# Patient Record
Sex: Male | Born: 1980 | Race: Black or African American | Hispanic: No | Marital: Single | Smoking: Current some day smoker
Health system: Southern US, Community
[De-identification: ages and names within clinical notes are randomized; demographics above are authoritative.]

## PROBLEM LIST (undated history)

## (undated) DIAGNOSIS — N200 Calculus of kidney: Secondary | ICD-10-CM

## (undated) DIAGNOSIS — W3400XA Accidental discharge from unspecified firearms or gun, initial encounter: Secondary | ICD-10-CM

## (undated) HISTORY — PX: COLON SURGERY: SHX602

---

## 2012-12-10 ENCOUNTER — Emergency Department (HOSPITAL_COMMUNITY): Payer: Self-pay

## 2012-12-10 ENCOUNTER — Encounter (HOSPITAL_COMMUNITY): Payer: Self-pay

## 2012-12-10 ENCOUNTER — Emergency Department (HOSPITAL_COMMUNITY)
Admission: EM | Admit: 2012-12-10 | Discharge: 2012-12-10 | Disposition: A | Discharge status: Home / Self Care | Payer: Self-pay | Attending: Emergency Medicine | Admitting: Emergency Medicine

## 2012-12-10 DIAGNOSIS — N200 Calculus of kidney: Secondary | ICD-10-CM | POA: Insufficient documentation

## 2012-12-10 DIAGNOSIS — F17200 Nicotine dependence, unspecified, uncomplicated: Secondary | ICD-10-CM | POA: Insufficient documentation

## 2012-12-10 LAB — CBC WITH DIFFERENTIAL/PLATELET
Eosinophils Absolute: 0 10*3/uL (ref 0.0–0.7)
Eosinophils Relative: 0 % (ref 0–5)
HCT: 39.8 % (ref 39.0–52.0)
Hemoglobin: 13.6 g/dL (ref 13.0–17.0)
Lymphs Abs: 1 10*3/uL (ref 0.7–4.0)
MCH: 29.7 pg (ref 26.0–34.0)
MCV: 86.9 fL (ref 78.0–100.0)
Monocytes Relative: 15 % — ABNORMAL HIGH (ref 3–12)
Neutrophils Relative %: 76 % (ref 43–77)
RBC: 4.58 MIL/uL (ref 4.22–5.81)

## 2012-12-10 LAB — BASIC METABOLIC PANEL
BUN: 11 mg/dL (ref 6–23)
GFR calc non Af Amer: >90 mL/min (ref >90)
Glucose, Bld: 100 mg/dL — ABNORMAL HIGH (ref 70–99)
Potassium: 4.1 mEq/L (ref 3.5–5.1)

## 2012-12-10 LAB — URINALYSIS, ROUTINE W REFLEX MICROSCOPIC
Bilirubin Urine: NEGATIVE
Specific Gravity, Urine: 1.017 (ref 1.005–1.030)
Urobilinogen, UA: 0.2 mg/dL (ref 0.0–1.0)

## 2012-12-10 LAB — URINE MICROSCOPIC-ADD ON

## 2012-12-10 MED ORDER — ONDANSETRON HCL 4 MG PO TABS: 4.0000 mg | ORAL_TABLET | Freq: Four times a day (QID) | ORAL | Status: DC

## 2012-12-10 MED ORDER — ONDANSETRON HCL 4 MG/2ML IJ SOLN
4.0000 mg | Freq: Once | INTRAMUSCULAR | Status: AC
  Administered 2012-12-10: 4 mg via INTRAVENOUS
  Filled 2012-12-10: qty 2

## 2012-12-10 MED ORDER — MORPHINE SULFATE 4 MG/ML IJ SOLN
6.0000 mg | Freq: Once | INTRAMUSCULAR | Status: AC
  Administered 2012-12-10: 6 mg via INTRAVENOUS
  Filled 2012-12-10: qty 2

## 2012-12-10 MED ORDER — HYDROCODONE-ACETAMINOPHEN 5-325 MG PO TABS: 2.0000 | ORAL_TABLET | ORAL | Status: DC | PRN

## 2012-12-10 NOTE — ED Notes (Signed)
Bed:WA23<BR> Expected date:<BR> Expected time:<BR> Means of arrival:<BR> Comments:<BR> EMS
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
## 2012-12-10 NOTE — ED Notes (Signed)
PA at bedside.
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
## 2012-12-12 NOTE — ED Provider Notes (Signed)
Medical screening examination/treatment/procedure(s) were performed by non-physician practitioner and as supervising physician I was immediately available for consultation/collaboration.   Loren Racer, MD 12/12/12 [phone]

## 2014-01-30 ENCOUNTER — Emergency Department (HOSPITAL_COMMUNITY)
Admission: EM | Admit: 2014-01-30 | Discharge: 2014-01-30 | Discharge status: Against Medical Advice | Payer: Self-pay | Attending: Emergency Medicine | Admitting: Emergency Medicine

## 2014-01-30 ENCOUNTER — Encounter (HOSPITAL_COMMUNITY): Payer: Self-pay | Admitting: Emergency Medicine

## 2014-01-30 DIAGNOSIS — F17200 Nicotine dependence, unspecified, uncomplicated: Secondary | ICD-10-CM | POA: Insufficient documentation

## 2014-01-30 DIAGNOSIS — R1900 Intra-abdominal and pelvic swelling, mass and lump, unspecified site: Secondary | ICD-10-CM | POA: Insufficient documentation

## 2014-01-30 NOTE — ED Notes (Signed)
Pt has bump at top of navel. Had stitches placed 10 + years ago. Had hx of ingrown stitch in that area. Denies pain

## 2015-03-11 DIAGNOSIS — Z720 Tobacco use: Secondary | ICD-10-CM | POA: Insufficient documentation

## 2015-03-11 DIAGNOSIS — L089 Local infection of the skin and subcutaneous tissue, unspecified: Secondary | ICD-10-CM | POA: Insufficient documentation

## 2015-03-12 ENCOUNTER — Encounter (HOSPITAL_COMMUNITY): Payer: Self-pay

## 2015-03-12 ENCOUNTER — Emergency Department (HOSPITAL_COMMUNITY)
Admission: EM | Admit: 2015-03-12 | Discharge: 2015-03-12 | Disposition: A | Discharge status: Home / Self Care | Payer: Self-pay | Attending: Emergency Medicine | Admitting: Emergency Medicine

## 2015-03-12 DIAGNOSIS — L089 Local infection of the skin and subcutaneous tissue, unspecified: Secondary | ICD-10-CM

## 2015-03-12 MED ORDER — CEPHALEXIN 500 MG PO CAPS
500.0000 mg | ORAL_CAPSULE | Freq: Once | ORAL | Status: AC
  Administered 2015-03-12: 500 mg via ORAL
  Filled 2015-03-12: qty 1

## 2015-03-12 MED ORDER — TRAMADOL HCL 50 MG PO TABS: 50.0000 mg | ORAL_TABLET | Freq: Four times a day (QID) | ORAL | Status: DC | PRN

## 2015-03-12 MED ORDER — TRAMADOL HCL 50 MG PO TABS
50.0000 mg | ORAL_TABLET | Freq: Once | ORAL | Status: AC
  Administered 2015-03-12: 50 mg via ORAL
  Filled 2015-03-12: qty 1

## 2015-03-12 MED ORDER — CEPHALEXIN 500 MG PO CAPS: 500.0000 mg | ORAL_CAPSULE | Freq: Four times a day (QID) | ORAL | Status: DC

## 2015-03-12 NOTE — Discharge Instructions (Signed)
Take keflex as directed until gone. Take Tramadol as needed for pain. Return to the ED with worsening or concerning symptoms.

## 2015-03-12 NOTE — ED Provider Notes (Signed)
CSN: [id number]     Arrival date & time 03/11/15  2349 History   First MD Initiated Contact with Patient 03/12/15 [phone]     Chief Complaint  Patient presents with  . Wound Check     (Consider location/radiation/quality/duration/timing/severity/associated sxs/prior Treatment) Patient is a 34 y.o. male presenting with rash. The history is provided by the patient. No language interpreter was used.  Rash Location:  Torso Torso rash location: periumbilical. Quality: burning, itchiness and redness   Severity:  Moderate Onset quality:  Gradual Duration:  1 day Timing:  Constant Progression:  Worsening Chronicity:  New (patient believes this is from an intraabdominal suture that was placed 14 years ago) Context: not animal contact, not chemical exposure, not diapers, not hot tub use, not insect bite/sting, not new detergent/soap, not plant contact and not sick contacts   Relieved by:  Nothing Worsened by:  Nothing tried Ineffective treatments:  None tried Associated symptoms: no abdominal pain, no diarrhea, no fatigue, no fever, no joint pain, no nausea, no shortness of breath and not vomiting     History reviewed. No pertinent past medical history. Past Surgical History  Procedure Laterality Date  . Colon surgery     History reviewed. No pertinent family history. History  Substance Use Topics  . Smoking status: Current Every Day Smoker -- 0.50 packs/day    Types: Cigarettes  . Smokeless tobacco: Not on file  . Alcohol Use: Yes    Review of Systems  Constitutional: Negative for fever, chills and fatigue.  HENT: Negative for trouble swallowing.   Eyes: Negative for visual disturbance.  Respiratory: Negative for shortness of breath.   Cardiovascular: Negative for chest pain and palpitations.  Gastrointestinal: Negative for nausea, vomiting, abdominal pain and diarrhea.  Genitourinary: Negative for dysuria and difficulty urinating.  Musculoskeletal: Negative for arthralgias and  neck pain.  Skin: Positive for rash and wound. Negative for color change.  Neurological: Negative for dizziness and weakness.  Psychiatric/Behavioral: Negative for dysphoric mood.      Allergies  Review of patient's allergies indicates no known allergies.  Home Medications   Prior to Admission medications   Medication Sig Start Date End Date Taking? Authorizing Provider  HYDROcodone-acetaminophen (NORCO/VICODIN) 5-325 MG per tablet Take 2 tablets by mouth every 4 (four) hours as needed for pain. Patient not taking: Reported on 03/12/2015 12/10/12   Fayrene Helper, PA-C  ondansetron (ZOFRAN) 4 MG tablet Take 1 tablet (4 mg total) by mouth every 6 (six) hours. Patient not taking: Reported on 03/12/2015 12/10/12   Fayrene Helper, PA-C   BP 117/73 mmHg  Pulse 90  Temp(Src) 97.7 F (36.5 C) (Oral)  Resp 18  SpO2 100% Physical Exam  Constitutional: He is oriented to person, place, and time. He appears well-developed and well-nourished. No distress.  HENT:  Head: Normocephalic and atraumatic.  Eyes: Conjunctivae are normal.  Neck: Normal range of motion.  Cardiovascular: Normal rate and regular rhythm.  Exam reveals no gallop and no friction rub.   No murmur heard. Pulmonary/Chest: Effort normal and breath sounds normal. He has no wheezes. He has no rales. He exhibits no tenderness.  Abdominal: Soft. There is no tenderness.  Musculoskeletal: Normal range of motion.  Neurological: He is alert and oriented to person, place, and time. Coordination normal.  Speech is goal-oriented. Moves limbs without ataxia.   Skin: Skin is warm and dry.  2x1cm area of erythema, edema and tenderness superior to the navel. No open wound or discharge.   Psychiatric: He  has a normal mood and affect. His behavior is normal.  Nursing note and vitals reviewed.   ED Course  Procedures (including critical care time) Labs Review Labs Reviewed - No data to display  Imaging Review No results found.   EKG  Interpretation None      MDM   Final diagnoses:  Skin infection    4:02 AM Patient has a small area of localized cellulitis. Patient will be treated with keflex and tramadol. Vitals stable and patient afebrile. Patient will be discharged with instructions to return with worsening or concerning symptoms.     Emilia BeckKaitlyn Maryfrances Portugal, PA-C 03/12/15 0408  April Palumbo, MD 03/12/15 [phone]

## 2015-03-12 NOTE — ED Notes (Signed)
Patient reports he has a small area in his umbilical area that has been itching today.  He says he thinks it is from a suture that was placed during a surgery 14 years PTA.

## 2015-04-17 ENCOUNTER — Encounter (HOSPITAL_COMMUNITY): Payer: Self-pay | Admitting: Emergency Medicine

## 2015-04-17 ENCOUNTER — Emergency Department (HOSPITAL_COMMUNITY)
Admission: EM | Admit: 2015-04-17 | Discharge: 2015-04-17 | Disposition: A | Discharge status: Home / Self Care | Payer: No Typology Code available for payment source | Attending: Emergency Medicine | Admitting: Emergency Medicine

## 2015-04-17 ENCOUNTER — Emergency Department (HOSPITAL_COMMUNITY): Payer: No Typology Code available for payment source

## 2015-04-17 DIAGNOSIS — Y998 Other external cause status: Secondary | ICD-10-CM | POA: Diagnosis not present

## 2015-04-17 DIAGNOSIS — Y9389 Activity, other specified: Secondary | ICD-10-CM | POA: Diagnosis not present

## 2015-04-17 DIAGNOSIS — Y92410 Unspecified street and highway as the place of occurrence of the external cause: Secondary | ICD-10-CM | POA: Diagnosis not present

## 2015-04-17 DIAGNOSIS — Z792 Long term (current) use of antibiotics: Secondary | ICD-10-CM | POA: Insufficient documentation

## 2015-04-17 DIAGNOSIS — Z720 Tobacco use: Secondary | ICD-10-CM | POA: Insufficient documentation

## 2015-04-17 DIAGNOSIS — T1490XA Injury, unspecified, initial encounter: Secondary | ICD-10-CM

## 2015-04-17 DIAGNOSIS — S0990XA Unspecified injury of head, initial encounter: Secondary | ICD-10-CM | POA: Diagnosis not present

## 2015-04-17 DIAGNOSIS — S199XXA Unspecified injury of neck, initial encounter: Secondary | ICD-10-CM | POA: Diagnosis not present

## 2015-04-17 MED ORDER — ACETAMINOPHEN 500 MG PO TABS
1000.0000 mg | ORAL_TABLET | Freq: Once | ORAL | Status: AC
Start: 2015-04-17 — End: 2015-04-17
  Administered 2015-04-17: 1000 mg via ORAL
  Filled 2015-04-17: qty 2

## 2015-04-17 MED ORDER — CYCLOBENZAPRINE HCL 10 MG PO TABS: 10.0000 mg | ORAL_TABLET | Freq: Two times a day (BID) | ORAL | Status: DC | PRN

## 2015-04-17 MED ORDER — NAPROXEN 500 MG PO TABS: 500.0000 mg | ORAL_TABLET | Freq: Two times a day (BID) | ORAL | Status: DC

## 2015-04-17 NOTE — Discharge Instructions (Signed)
Head injury ° °You have had a head injury which does not appear to require admission at this time. A concussion is a status changed mental ability because of trauma. ° °Seek immediate medical attention if: ° °· There is confusion or drowsiness °· You cannot awaken the injured portion °· (Although children frequently become drowsy after injury) °· There is nausea or continued, forceful vomiting °· You notice dizziness or unsteadiness which is getting worse, or inability to walk °· You have convulsions or unconsciousness °· You experience a severe, persistent headaches not relieved by Tylenol. (Do not take aspirin as this in pairs clotting abilities). Take other pain medications only as directed °· You cannot use arms or legs normally °· There are changes in pupil size of the eye °· There is clear or bloody discharge from the nose or ears °· Change in speech, vision, swallowing or understanding. °· Localized weakness, numbness, tingling or change in bowel or bladder control ° ° °Please followup with your doctor in the next 2 days if still having symptoms. If you do not have a family doctor, see the list of followup contact information below. ° °RESOURCE GUIDE ° °Dental Problems ° °Patients with Medicaid: °[facility]                     [facility] °[address]                                           [address] °Phone:  [phone]                                                  Phone:  [phone] ° °If unable to pay or uninsured, contact:  [facility] or [facility] to become qualified for the adult dental clinic. ° °Chronic Pain Problems °Contact [facility]  [phone] °Patients need to be referred by their primary care doctor. ° °Insufficient Money for Medicine °Contact [employer]:  call "211" or [facility] 271-5999. ° °No Primary Care Doctor °Call [facility]  832-8000 °Other agencies that provide inexpensive medical care ° [facility]  832-8035 °   [facility]  832-7272 °   [facility]  [phone] °   [facility]  832-4777 °   [facility]  373-0678 °   [facility]  272-1050 ° °Psychological Services °[facility]  832-9600 °[facility]  [phone] °[facility]   [phone] (emergency services 641-4993) ° °Substance Abuse Resources °[facility]  336-882-2125 °[facility] 336-784-9470 °[facility] 336-285-9073 °[facility] 336-845-3988 °Residential & Outpatient Substance Abuse Program  800-659-3381 ° °Abuse/Neglect °[place] [facility] (336) 641-3795 °[place] [facility] 800-378-5315 (After Hours) ° °Emergency Shelter °[facility] (336) 271-5985 ° °Maternity Homes °[facility] (336) 275-9566 °[facility] (704) 372-4663 ° °MRSA Hotline #:   832-7006 ° ° ° °[place] [facility] ° °[facility]     [employer]                            [facility] °[address] [place]                       [address]      [address]  °[place]                                                [place]                            [place] °Phone:  349-3220                                   Phone:  [phone]                 Phone:  342-8140 ° °[facility] °Phone:  342-8316 ° °[place] [facility] °(336) 342-1394 °(336) 342-3537 (After Hours) ° ° ° ° °
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
## 2015-07-12 ENCOUNTER — Telehealth (HOSPITAL_COMMUNITY): Payer: Self-pay

## 2015-07-12 NOTE — Telephone Encounter (Signed)
called needing work note for 12 days. unable to write note at this time.

## 2015-07-28 ENCOUNTER — Encounter (HOSPITAL_COMMUNITY): Payer: Self-pay | Admitting: *Deleted

## 2015-07-28 ENCOUNTER — Emergency Department (HOSPITAL_COMMUNITY): Payer: Self-pay

## 2015-07-28 ENCOUNTER — Emergency Department (HOSPITAL_COMMUNITY)
Admission: EM | Admit: 2015-07-28 | Discharge: 2015-07-28 | Disposition: A | Discharge status: Home / Self Care | Payer: Self-pay | Attending: Emergency Medicine | Admitting: Emergency Medicine

## 2015-07-28 DIAGNOSIS — Z87828 Personal history of other (healed) physical injury and trauma: Secondary | ICD-10-CM | POA: Insufficient documentation

## 2015-07-28 DIAGNOSIS — R109 Unspecified abdominal pain: Secondary | ICD-10-CM

## 2015-07-28 DIAGNOSIS — Z720 Tobacco use: Secondary | ICD-10-CM | POA: Insufficient documentation

## 2015-07-28 DIAGNOSIS — R1030 Lower abdominal pain, unspecified: Secondary | ICD-10-CM | POA: Insufficient documentation

## 2015-07-28 HISTORY — DX: Accidental discharge from unspecified firearms or gun, initial encounter: W34.00XA

## 2015-07-28 LAB — I-STAT CHEM 8, ED
BUN: 13 mg/dL (ref 6–20)
CHLORIDE: 103 mmol/L (ref 101–111)
CREATININE: 0.8 mg/dL (ref 0.61–1.24)
Calcium, Ion: 1.14 mmol/L (ref 1.12–1.23)
GLUCOSE: 101 mg/dL — AB (ref 65–99)
HEMATOCRIT: 43 % (ref 39.0–52.0)
Hemoglobin: 14.6 g/dL (ref 13.0–17.0)
POTASSIUM: 5.1 mmol/L (ref 3.5–5.1)
Sodium: 140 mmol/L (ref 135–145)
TCO2: 28 mmol/L (ref 0–100)

## 2015-07-28 MED ORDER — FENTANYL CITRATE (PF) 100 MCG/2ML IJ SOLN
100.0000 ug | Freq: Once | INTRAMUSCULAR | Status: AC
  Administered 2015-07-28: 100 ug via INTRAVENOUS
  Filled 2015-07-28: qty 2

## 2015-07-28 MED ORDER — IOHEXOL 300 MG/ML  SOLN
100.0000 mL | Freq: Once | INTRAMUSCULAR | Status: AC | PRN
  Administered 2015-07-28: 100 mL via INTRAVENOUS

## 2015-07-28 MED ORDER — SODIUM CHLORIDE 0.9 % IV BOLUS (SEPSIS)
1000.0000 mL | Freq: Once | INTRAVENOUS | Status: AC
  Administered 2015-07-28: 1000 mL via INTRAVENOUS

## 2015-07-28 MED ORDER — IOHEXOL 300 MG/ML  SOLN
25.0000 mL | Freq: Once | INTRAMUSCULAR | Status: AC | PRN
  Administered 2015-07-28: 25 mL via ORAL

## 2015-07-28 MED ORDER — TRAMADOL HCL 50 MG PO TABS: 50.0000 mg | ORAL_TABLET | Freq: Four times a day (QID) | ORAL | Status: DC | PRN
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
## 2015-07-28 NOTE — ED Notes (Signed)
Attempted to call for patient, no answer, not found in ER lobby. Will attempt again shortly
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
## 2016-05-10 ENCOUNTER — Emergency Department (HOSPITAL_COMMUNITY): Payer: Self-pay

## 2016-05-10 ENCOUNTER — Emergency Department (HOSPITAL_COMMUNITY)
Admission: EM | Admit: 2016-05-10 | Discharge: 2016-05-10 | Disposition: A | Discharge status: Home / Self Care | Payer: Self-pay | Attending: Emergency Medicine | Admitting: Emergency Medicine

## 2016-05-10 ENCOUNTER — Encounter (HOSPITAL_COMMUNITY): Payer: Self-pay | Admitting: *Deleted

## 2016-05-10 DIAGNOSIS — N1330 Unspecified hydronephrosis: Secondary | ICD-10-CM | POA: Insufficient documentation

## 2016-05-10 DIAGNOSIS — N23 Unspecified renal colic: Secondary | ICD-10-CM | POA: Insufficient documentation

## 2016-05-10 DIAGNOSIS — F17210 Nicotine dependence, cigarettes, uncomplicated: Secondary | ICD-10-CM | POA: Insufficient documentation

## 2016-05-10 DIAGNOSIS — Z79899 Other long term (current) drug therapy: Secondary | ICD-10-CM | POA: Insufficient documentation

## 2016-05-10 DIAGNOSIS — R1032 Left lower quadrant pain: Secondary | ICD-10-CM

## 2016-05-10 DIAGNOSIS — N132 Hydronephrosis with renal and ureteral calculous obstruction: Secondary | ICD-10-CM

## 2016-05-10 DIAGNOSIS — N201 Calculus of ureter: Secondary | ICD-10-CM | POA: Insufficient documentation

## 2016-05-10 LAB — URINE MICROSCOPIC-ADD ON: BACTERIA UA: NONE SEEN

## 2016-05-10 LAB — COMPREHENSIVE METABOLIC PANEL
ALBUMIN: 4.2 g/dL (ref 3.5–5.0)
ALK PHOS: 86 U/L (ref 38–126)
ALT: 30 U/L (ref 17–63)
ANION GAP: 7 (ref 5–15)
AST: 32 U/L (ref 15–41)
BILIRUBIN TOTAL: 0.5 mg/dL (ref 0.3–1.2)
BUN: 18 mg/dL (ref 6–20)
CALCIUM: 9.2 mg/dL (ref 8.9–10.3)
CO2: 25 mmol/L (ref 22–32)
CREATININE: 0.94 mg/dL (ref 0.61–1.24)
Chloride: 106 mmol/L (ref 101–111)
GFR calc Af Amer: >60 mL/min (ref >60)
GFR calc non Af Amer: >60 mL/min (ref >60)
GLUCOSE: 125 mg/dL — AB (ref 65–99)
Potassium: 3.2 mmol/L — ABNORMAL LOW (ref 3.5–5.1)
Sodium: 138 mmol/L (ref 135–145)
TOTAL PROTEIN: 6.7 g/dL (ref 6.5–8.1)

## 2016-05-10 LAB — URINALYSIS, ROUTINE W REFLEX MICROSCOPIC
BILIRUBIN URINE: NEGATIVE
Glucose, UA: NEGATIVE mg/dL
KETONES UR: NEGATIVE mg/dL
Leukocytes, UA: NEGATIVE
NITRITE: NEGATIVE
PROTEIN: NEGATIVE mg/dL
SPECIFIC GRAVITY, URINE: 1.016 (ref 1.005–1.030)
pH: 7.5 (ref 5.0–8.0)

## 2016-05-10 LAB — CBC
HCT: 38.5 % — ABNORMAL LOW (ref 39.0–52.0)
HEMOGLOBIN: 13.2 g/dL (ref 13.0–17.0)
MCH: 29.9 pg (ref 26.0–34.0)
MCHC: 34.3 g/dL (ref 30.0–36.0)
MCV: 87.3 fL (ref 78.0–100.0)
PLATELETS: 253 10*3/uL (ref 150–400)
RBC: 4.41 MIL/uL (ref 4.22–5.81)
RDW: 14.6 % (ref 11.5–15.5)
WBC: 11 10*3/uL — ABNORMAL HIGH (ref 4.0–10.5)

## 2016-05-10 LAB — RAPID URINE DRUG SCREEN, HOSP PERFORMED
Amphetamines: NOT DETECTED
Barbiturates: NOT DETECTED
Benzodiazepines: NOT DETECTED
COCAINE: NOT DETECTED
OPIATES: NOT DETECTED
Tetrahydrocannabinol: POSITIVE — AB

## 2016-05-10 LAB — LIPASE, BLOOD: Lipase: 23 U/L (ref 11–51)

## 2016-05-10 MED ORDER — FENTANYL CITRATE (PF) 100 MCG/2ML IJ SOLN
50.0000 ug | INTRAMUSCULAR | Status: DC | PRN
Start: 2016-05-10 — End: 2016-05-11
  Administered 2016-05-10: 50 ug via NASAL
  Filled 2016-05-10: qty 2

## 2016-05-10 MED ORDER — TAMSULOSIN HCL 0.4 MG PO CAPS: ORAL_CAPSULE | ORAL | Status: DC

## 2016-05-10 MED ORDER — KETOROLAC TROMETHAMINE 30 MG/ML IJ SOLN
30.0000 mg | Freq: Once | INTRAMUSCULAR | Status: AC
  Administered 2016-05-10: 30 mg via INTRAVENOUS
  Filled 2016-05-10: qty 1

## 2016-05-10 MED ORDER — HYDROMORPHONE HCL 2 MG/ML IJ SOLN
2.0000 mg | Freq: Once | INTRAMUSCULAR | Status: AC
  Administered 2016-05-10: 2 mg via INTRAMUSCULAR
  Filled 2016-05-10: qty 1

## 2016-05-10 MED ORDER — ONDANSETRON 4 MG PO TBDP
4.0000 mg | ORAL_TABLET | Freq: Once | ORAL | Status: AC | PRN
  Administered 2016-05-10: 4 mg via ORAL
  Filled 2016-05-10: qty 1

## 2016-05-10 MED ORDER — HYDROMORPHONE HCL 1 MG/ML IJ SOLN
1.0000 mg | Freq: Once | INTRAMUSCULAR | Status: AC
  Administered 2016-05-10: 1 mg via INTRAVENOUS
  Filled 2016-05-10: qty 1

## 2016-05-10 MED ORDER — ONDANSETRON HCL 4 MG/2ML IJ SOLN
4.0000 mg | Freq: Once | INTRAMUSCULAR | Status: AC
  Administered 2016-05-10: 4 mg via INTRAVENOUS
  Filled 2016-05-10: qty 2

## 2016-05-10 MED ORDER — ONDANSETRON 8 MG PO TBDP
8.0000 mg | ORAL_TABLET | Freq: Once | ORAL | Status: DC
  Filled 2016-05-10: qty 1

## 2016-05-10 MED ORDER — OXYCODONE-ACETAMINOPHEN 5-325 MG PO TABS: 1.0000 | ORAL_TABLET | ORAL | Status: DC | PRN

## 2016-05-10 NOTE — Discharge Instructions (Signed)
Drain your urine, to catch the stone. Follow-up with the urologist for checkup, next week. Do not work or drive when taking the pain medication.   Kidney Stones Kidney stones (urolithiasis) are deposits that form inside your kidneys. The intense pain is caused by the stone moving through the urinary tract. When the stone moves, the ureter goes into spasm around the stone. The stone is usually passed in the urine.  CAUSES   A disorder that makes certain neck glands produce too much parathyroid hormone (primary hyperparathyroidism).  A buildup of uric acid crystals, similar to gout in your joints.  Narrowing (stricture) of the ureter.  A kidney obstruction present at birth (congenital obstruction).  Previous surgery on the kidney or ureters.  Numerous kidney infections. SYMPTOMS   Feeling sick to your stomach (nauseous).  Throwing up (vomiting).  Blood in the urine (hematuria).  Pain that usually spreads (radiates) to the groin.  Frequency or urgency of urination. DIAGNOSIS   Taking a history and physical exam.  Blood or urine tests.  CT scan.  Occasionally, an examination of the inside of the urinary bladder (cystoscopy) is performed. TREATMENT   Observation.  Increasing your fluid intake.  Extracorporeal shock wave lithotripsy--This is a noninvasive procedure that uses shock waves to break up kidney stones.  Surgery may be needed if you have severe pain or persistent obstruction. There are various surgical procedures. Most of the procedures are performed with the use of small instruments. Only small incisions are needed to accommodate these instruments, so recovery time is minimized. The size, location, and chemical composition are all important variables that will determine the proper choice of action for you. Talk to your health care provider to better understand your situation so that you will minimize the risk of injury to yourself and your kidney.  HOME CARE  INSTRUCTIONS   Drink enough water and fluids to keep your urine clear or pale yellow. This will help you to pass the stone or stone fragments.  Strain all urine through the provided strainer. Keep all particulate matter and stones for your health care provider to see. The stone causing the pain may be as small as a grain of salt. It is very important to use the strainer each and every time you pass your urine. The collection of your stone will allow your health care provider to analyze it and verify that a stone has actually passed. The stone analysis will often identify what you can do to reduce the incidence of recurrences.  Only take over-the-counter or prescription medicines for pain, discomfort, or fever as directed by your health care provider.  Keep all follow-up visits as told by your health care provider. This is important.  Get follow-up X-rays if required. The absence of pain does not always mean that the stone has passed. It may have only stopped moving. If the urine remains completely obstructed, it can cause loss of kidney function or even complete destruction of the kidney. It is your responsibility to make sure X-rays and follow-ups are completed. Ultrasounds of the kidney can show blockages and the status of the kidney. Ultrasounds are not associated with any radiation and can be performed easily in a matter of minutes.  Make changes to your daily diet as told by your health care provider. You may be told to:  Limit the amount of salt that you eat.  Eat 5 or more servings of fruits and vegetables each day.  Limit the amount of meat, poultry, fish,  and eggs that you eat.  Collect a 24-hour urine sample as told by your health care provider.You may need to collect another urine sample every 6-12 months. SEEK MEDICAL CARE IF:  You experience pain that is progressive and unresponsive to any pain medicine you have been prescribed. SEEK IMMEDIATE MEDICAL CARE IF:   Pain cannot be  controlled with the prescribed medicine.  You have a fever or shaking chills.  The severity or intensity of pain increases over 18 hours and is not relieved by pain medicine.  You develop a new onset of abdominal pain.  You feel faint or pass out.  You are unable to urinate.   This information is not intended to replace advice given to you by your health care provider. Make sure you discuss any questions you have with your health care provider.   Document Released: 12/04/2005 Document Revised: 08/25/2015 Document Reviewed: 05/07/2013 Elsevier Interactive Patient Education 2016 [employer]  Renal Colic Renal colic is pain that is caused by passing a kidney stone. The pain can be sharp and severe. It may be felt in the back, abdomen, side (flank), or groin. It can cause nausea. Renal colic can come and go. HOME CARE INSTRUCTIONS Watch your condition for any changes. The following actions may help to lessen any discomfort that you are feeling:  Take medicines only as directed by your health care provider.  Ask your health care provider if it is okay to take over-the-counter pain medicine.  Drink enough fluid to keep your urine clear or pale yellow. Drink 6-8 glasses of water each day.  Limit the amount of salt that you eat to less than 2 grams per day.  Reduce the amount of protein in your diet. Eat less meat, fish, nuts, and dairy.  Avoid foods such as spinach, rhubarb, nuts, or bran. These may make kidney stones more likely to form. SEEK MEDICAL CARE IF:  You have a fever or chills.  Your urine smells bad or looks cloudy.  You have pain or burning when you pass urine. SEEK IMMEDIATE MEDICAL CARE IF:  Your flank pain or groin pain suddenly worsens.  You become confused or disoriented or you lose consciousness.   This information is not intended to replace advice given to you by your health care provider. Make sure you discuss any questions you have with your health care  provider.   Document Released: 09/13/2005 Document Revised: 12/25/2014 Document Reviewed: 10/14/2014 Elsevier Interactive Patient Education [employer]

## 2016-05-10 NOTE — ED Notes (Signed)
Pt states that he had a sudden onset of lower abd pain radiating to left flank area; pt c/o nausea with no vomiting; pt states that the pain got worse after urinating

## 2016-05-10 NOTE — ED Notes (Signed)
Unable to collect labs patient stated he can not be stuck at this time because of his pain.  I made nurse aware
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
## 2016-10-13 ENCOUNTER — Emergency Department (HOSPITAL_COMMUNITY): Payer: Self-pay

## 2016-10-13 ENCOUNTER — Encounter (HOSPITAL_COMMUNITY): Payer: Self-pay | Admitting: Emergency Medicine

## 2016-10-13 ENCOUNTER — Emergency Department (HOSPITAL_COMMUNITY)
Admission: EM | Admit: 2016-10-13 | Discharge: 2016-10-14 | Disposition: A | Discharge status: Home / Self Care | Payer: Self-pay | Attending: Emergency Medicine | Admitting: Emergency Medicine

## 2016-10-13 DIAGNOSIS — Z4802 Encounter for removal of sutures: Secondary | ICD-10-CM | POA: Insufficient documentation

## 2016-10-13 DIAGNOSIS — Y929 Unspecified place or not applicable: Secondary | ICD-10-CM | POA: Insufficient documentation

## 2016-10-13 DIAGNOSIS — W228XXA Striking against or struck by other objects, initial encounter: Secondary | ICD-10-CM | POA: Insufficient documentation

## 2016-10-13 DIAGNOSIS — F17210 Nicotine dependence, cigarettes, uncomplicated: Secondary | ICD-10-CM | POA: Insufficient documentation

## 2016-10-13 DIAGNOSIS — Z189 Retained foreign body fragments, unspecified material: Secondary | ICD-10-CM

## 2016-10-13 DIAGNOSIS — S59901A Unspecified injury of right elbow, initial encounter: Secondary | ICD-10-CM | POA: Insufficient documentation

## 2016-10-13 DIAGNOSIS — M25521 Pain in right elbow: Secondary | ICD-10-CM

## 2016-10-13 DIAGNOSIS — T8189XA Other complications of procedures, not elsewhere classified, initial encounter: Secondary | ICD-10-CM

## 2016-10-13 DIAGNOSIS — Y939 Activity, unspecified: Secondary | ICD-10-CM | POA: Insufficient documentation

## 2016-10-13 DIAGNOSIS — R109 Unspecified abdominal pain: Secondary | ICD-10-CM | POA: Insufficient documentation

## 2016-10-13 DIAGNOSIS — Z79899 Other long term (current) drug therapy: Secondary | ICD-10-CM | POA: Insufficient documentation

## 2016-10-13 DIAGNOSIS — Y990 Civilian activity done for income or pay: Secondary | ICD-10-CM | POA: Insufficient documentation
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
## 2016-10-14 MED ORDER — NAPROXEN 250 MG PO TABS: 250.0000 mg | ORAL_TABLET | Freq: Two times a day (BID) | ORAL | 0 refills | Status: DC

## 2016-10-14 MED ORDER — CEPHALEXIN 500 MG PO CAPS: 500.0000 mg | ORAL_CAPSULE | Freq: Four times a day (QID) | ORAL | 0 refills | Status: DC

## 2017-05-22 ENCOUNTER — Encounter (HOSPITAL_COMMUNITY): Payer: Self-pay

## 2017-05-22 ENCOUNTER — Emergency Department (HOSPITAL_COMMUNITY)
Admission: EM | Admit: 2017-05-22 | Discharge: 2017-05-22 | Disposition: A | Discharge status: Home / Self Care | Payer: Self-pay | Attending: Emergency Medicine | Admitting: Emergency Medicine

## 2017-05-22 DIAGNOSIS — Z79899 Other long term (current) drug therapy: Secondary | ICD-10-CM | POA: Insufficient documentation

## 2017-05-22 DIAGNOSIS — Z4801 Encounter for change or removal of surgical wound dressing: Secondary | ICD-10-CM | POA: Insufficient documentation

## 2017-05-22 DIAGNOSIS — F17290 Nicotine dependence, other tobacco product, uncomplicated: Secondary | ICD-10-CM | POA: Insufficient documentation

## 2017-05-22 DIAGNOSIS — Z202 Contact with and (suspected) exposure to infections with a predominantly sexual mode of transmission: Secondary | ICD-10-CM | POA: Insufficient documentation

## 2017-05-22 DIAGNOSIS — Z4889 Encounter for other specified surgical aftercare: Secondary | ICD-10-CM

## 2017-05-22 MED ORDER — CEFTRIAXONE SODIUM 250 MG IJ SOLR
250.0000 mg | Freq: Once | INTRAMUSCULAR | Status: AC
  Administered 2017-05-22: 250 mg via INTRAMUSCULAR
  Filled 2017-05-22: qty 250

## 2017-05-22 MED ORDER — METRONIDAZOLE 500 MG PO TABS: 500.0000 mg | ORAL_TABLET | Freq: Two times a day (BID) | ORAL | 0 refills | Status: DC

## 2017-05-22 MED ORDER — CEPHALEXIN 500 MG PO CAPS: 500.0000 mg | ORAL_CAPSULE | Freq: Three times a day (TID) | ORAL | 0 refills | Status: DC

## 2017-05-22 MED ORDER — LIDOCAINE HCL 1 % IJ SOLN
INTRAMUSCULAR | Status: AC
  Administered 2017-05-22: 1.2 mL
  Filled 2017-05-22: qty 20

## 2017-05-22 MED ORDER — AZITHROMYCIN 250 MG PO TABS
1000.0000 mg | ORAL_TABLET | Freq: Once | ORAL | Status: AC
  Administered 2017-05-22: 1000 mg via ORAL
  Filled 2017-05-22: qty 4

## 2017-05-22 NOTE — Discharge Instructions (Signed)
It was my pleasure taking care of you today!  Please take all of your antibiotics until finished!  Please call the surgery clinic to schedule a follow up appointment for wound check. Use a condom with every sexual encounter Please return to the ER for worsening symptoms, high fevers or persistent vomiting.  You have been tested for chlamydia and gonorrhea. These results will be available in approximately 3 days. You will be notified if they are positive.   SEEK IMMEDIATE MEDICAL CARE IF:  You develop an oral temperature above 102 F (38.9 C), not controlled by medications or lasting more than 2 days.  You develop an increase in pain.  New or worsening symptoms develop You have any additional concerns.
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
## 2017-05-22 NOTE — ED Notes (Signed)
PT DISCHARGED. INSTRUCTIONS AND PRESCRIPTIONS GIVEN. AAOX4. PT IN NO APPARENT DISTRESS OR PAIN. THE OPPORTUNITY TO ASK QUESTIONS WAS PROVIDED. 

## 2017-05-22 NOTE — ED Triage Notes (Signed)
Patient states he had stitches to his abdomen and it was the kind that was suppose to dissolve. Stitch was placed 21 years ago. Patient states he has seen a small amount of pus from the area. Patient also is requesting a STD check. Patient denies any drainage, but states he was with a girl that states she was treated for trich.

## 2017-05-24 LAB — GC/CHLAMYDIA PROBE AMP (~~LOC~~) NOT AT ARMC
Chlamydia: NEGATIVE
Neisseria Gonorrhea: NEGATIVE

## 2018-08-08 ENCOUNTER — Encounter (HOSPITAL_COMMUNITY): Payer: Self-pay

## 2018-08-08 ENCOUNTER — Emergency Department (HOSPITAL_COMMUNITY): Payer: Self-pay

## 2018-08-08 ENCOUNTER — Other Ambulatory Visit: Payer: Self-pay

## 2018-08-08 ENCOUNTER — Emergency Department (HOSPITAL_COMMUNITY)
Admission: EM | Admit: 2018-08-08 | Discharge: 2018-08-08 | Disposition: A | Discharge status: Home / Self Care | Payer: Self-pay | Attending: Emergency Medicine | Admitting: Emergency Medicine

## 2018-08-08 DIAGNOSIS — Y999 Unspecified external cause status: Secondary | ICD-10-CM | POA: Insufficient documentation

## 2018-08-08 DIAGNOSIS — W109XXA Fall (on) (from) unspecified stairs and steps, initial encounter: Secondary | ICD-10-CM | POA: Insufficient documentation

## 2018-08-08 DIAGNOSIS — M25561 Pain in right knee: Secondary | ICD-10-CM | POA: Insufficient documentation

## 2018-08-08 DIAGNOSIS — Y9301 Activity, walking, marching and hiking: Secondary | ICD-10-CM | POA: Insufficient documentation

## 2018-08-08 DIAGNOSIS — M25551 Pain in right hip: Secondary | ICD-10-CM | POA: Insufficient documentation

## 2018-08-08 DIAGNOSIS — Y929 Unspecified place or not applicable: Secondary | ICD-10-CM | POA: Insufficient documentation

## 2018-08-08 DIAGNOSIS — Z5321 Procedure and treatment not carried out due to patient leaving prior to being seen by health care provider: Secondary | ICD-10-CM | POA: Insufficient documentation

## 2018-08-08 DIAGNOSIS — S50311A Abrasion of right elbow, initial encounter: Secondary | ICD-10-CM | POA: Insufficient documentation

## 2018-08-08 NOTE — ED Triage Notes (Signed)
Patient fell from 6 steps last night. Patient c/o right hip and right knee pain. Patient has an abrasion to the right elbow. patient denies hitting his head or having LOC.

## 2018-08-09 NOTE — ED Notes (Signed)
Follow up call made  No answer  08/09/18  0908  s Doralene Glanz rn

## 2019-04-17 ENCOUNTER — Other Ambulatory Visit: Payer: Self-pay

## 2019-04-17 ENCOUNTER — Encounter (HOSPITAL_COMMUNITY): Payer: Self-pay | Admitting: Emergency Medicine

## 2019-04-17 ENCOUNTER — Emergency Department (HOSPITAL_COMMUNITY)
Admission: EM | Admit: 2019-04-17 | Discharge: 2019-04-17 | Disposition: A | Discharge status: Home / Self Care | Payer: Self-pay | Attending: Emergency Medicine | Admitting: Emergency Medicine

## 2019-04-17 DIAGNOSIS — F17290 Nicotine dependence, other tobacco product, uncomplicated: Secondary | ICD-10-CM | POA: Insufficient documentation

## 2019-04-17 DIAGNOSIS — Z79899 Other long term (current) drug therapy: Secondary | ICD-10-CM | POA: Insufficient documentation

## 2019-04-17 DIAGNOSIS — L989 Disorder of the skin and subcutaneous tissue, unspecified: Secondary | ICD-10-CM | POA: Insufficient documentation

## 2019-04-17 MED ORDER — CLINDAMYCIN HCL 150 MG PO CAPS: 300.0000 mg | ORAL_CAPSULE | Freq: Four times a day (QID) | ORAL | 0 refills | Status: DC

## 2019-04-17 MED ORDER — IBUPROFEN 800 MG PO TABS: 800.0000 mg | ORAL_TABLET | Freq: Three times a day (TID) | ORAL | 0 refills | Status: DC

## 2019-04-17 NOTE — Discharge Instructions (Signed)
It was my pleasure taking care of you today!   Please take all of your antibiotics until finished!   Ibuprofen as needed for pain. You can also take Tylenol as needed for pain.   It is very important to do warm compresses three to four times a day. Take a warm washcloth and apply to the area for 15 minutes.   Return to ER for fever, if the area gets more swollen, new or worsening symptoms develop or you have any additional concerns.
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
## 2019-04-17 NOTE — ED Triage Notes (Signed)
C/o swelling on right jaw for 3 days that is painful.

## 2019-04-17 NOTE — ED Notes (Signed)
ED provider at bedside.

## 2019-06-12 ENCOUNTER — Other Ambulatory Visit: Payer: Self-pay

## 2019-06-12 ENCOUNTER — Emergency Department (HOSPITAL_COMMUNITY)
Admission: EM | Admit: 2019-06-12 | Discharge: 2019-06-12 | Disposition: A | Discharge status: Home / Self Care | Payer: Self-pay | Attending: Emergency Medicine | Admitting: Emergency Medicine

## 2019-06-12 ENCOUNTER — Encounter (HOSPITAL_COMMUNITY): Payer: Self-pay

## 2019-06-12 DIAGNOSIS — R1031 Right lower quadrant pain: Secondary | ICD-10-CM | POA: Insufficient documentation

## 2019-06-12 DIAGNOSIS — Z5321 Procedure and treatment not carried out due to patient leaving prior to being seen by health care provider: Secondary | ICD-10-CM | POA: Insufficient documentation

## 2019-06-12 NOTE — ED Triage Notes (Signed)
Pt states he was at work today when he thought he was having a right sided kidney stone. Some nausea. Pt took 1000mg  tylenol PTA

## 2019-07-22 ENCOUNTER — Encounter (HOSPITAL_COMMUNITY): Payer: Self-pay | Admitting: Emergency Medicine

## 2019-07-22 ENCOUNTER — Other Ambulatory Visit: Payer: Self-pay

## 2019-07-22 ENCOUNTER — Emergency Department (HOSPITAL_COMMUNITY)
Admission: EM | Admit: 2019-07-22 | Discharge: 2019-07-23 | Disposition: A | Discharge status: Home / Self Care | Payer: Self-pay | Attending: Emergency Medicine | Admitting: Emergency Medicine

## 2019-07-22 ENCOUNTER — Emergency Department (HOSPITAL_COMMUNITY): Admission: EM | Admit: 2019-07-22 | Discharge: 2019-07-22 | Discharge status: Against Medical Advice | Payer: Self-pay

## 2019-07-22 DIAGNOSIS — N200 Calculus of kidney: Secondary | ICD-10-CM | POA: Insufficient documentation

## 2019-07-22 DIAGNOSIS — Z79899 Other long term (current) drug therapy: Secondary | ICD-10-CM | POA: Insufficient documentation

## 2019-07-22 DIAGNOSIS — F17210 Nicotine dependence, cigarettes, uncomplicated: Secondary | ICD-10-CM | POA: Insufficient documentation

## 2019-07-22 HISTORY — DX: Calculus of kidney: N20.0

## 2019-07-22 MED ORDER — OXYCODONE-ACETAMINOPHEN 5-325 MG PO TABS
1.0000 | ORAL_TABLET | ORAL | Status: DC | PRN
  Administered 2019-07-22: 1 via ORAL
  Filled 2019-07-22: qty 1

## 2019-07-22 MED ORDER — SODIUM CHLORIDE 0.9% FLUSH
3.0000 mL | Freq: Once | INTRAVENOUS | Status: AC
  Administered 2019-07-23: 01:00:00 3 mL via INTRAVENOUS

## 2019-07-22 NOTE — ED Triage Notes (Signed)
Pt c/o lower abdominal pain x 2.5 hours. Denies nausea/vomiting/diarrhea, no urinary symptoms. Hx kidney stones, reports this feels similar.

## 2019-07-23 ENCOUNTER — Telehealth: Payer: Self-pay | Admitting: *Deleted

## 2019-07-23 ENCOUNTER — Emergency Department (HOSPITAL_COMMUNITY): Payer: Self-pay

## 2019-07-23 LAB — COMPREHENSIVE METABOLIC PANEL
ALT: 26 U/L (ref 0–44)
AST: 24 U/L (ref 15–41)
Albumin: 3.9 g/dL (ref 3.5–5.0)
Alkaline Phosphatase: 99 U/L (ref 38–126)
Anion gap: 10 (ref 5–15)
BUN: 14 mg/dL (ref 6–20)
CO2: 21 mmol/L — ABNORMAL LOW (ref 22–32)
Calcium: 9.5 mg/dL (ref 8.9–10.3)
Chloride: 108 mmol/L (ref 98–111)
Creatinine, Ser: 1.2 mg/dL (ref 0.61–1.24)
GFR calc Af Amer: >60 mL/min (ref >60)
GFR calc non Af Amer: >60 mL/min (ref >60)
Glucose, Bld: 116 mg/dL — ABNORMAL HIGH (ref 70–99)
Potassium: 4.3 mmol/L (ref 3.5–5.1)
Sodium: 139 mmol/L (ref 135–145)
Total Bilirubin: 0.5 mg/dL (ref 0.3–1.2)
Total Protein: 6.3 g/dL — ABNORMAL LOW (ref 6.5–8.1)

## 2019-07-23 LAB — URINALYSIS, ROUTINE W REFLEX MICROSCOPIC
Bilirubin Urine: NEGATIVE
Glucose, UA: NEGATIVE mg/dL
Ketones, ur: 5 mg/dL — AB
Nitrite: NEGATIVE
Protein, ur: 30 mg/dL — AB
Specific Gravity, Urine: 1.034 — ABNORMAL HIGH (ref 1.005–1.030)
pH: 5 (ref 5.0–8.0)

## 2019-07-23 LAB — CBC
HCT: 43 % (ref 39.0–52.0)
Hemoglobin: 14.3 g/dL (ref 13.0–17.0)
MCH: 30.2 pg (ref 26.0–34.0)
MCHC: 33.3 g/dL (ref 30.0–36.0)
MCV: 90.7 fL (ref 80.0–100.0)
Platelets: 299 10*3/uL (ref 150–400)
RBC: 4.74 MIL/uL (ref 4.22–5.81)
RDW: 14.6 % (ref 11.5–15.5)
WBC: 10.5 10*3/uL (ref 4.0–10.5)
nRBC: 0 % (ref 0.0–0.2)

## 2019-07-23 LAB — LIPASE, BLOOD: Lipase: 47 U/L (ref 11–51)

## 2019-07-23 MED ORDER — OXYCODONE-ACETAMINOPHEN 5-325 MG PO TABS: 1.0000 | ORAL_TABLET | Freq: Four times a day (QID) | ORAL | 0 refills | Status: DC | PRN

## 2019-07-23 MED ORDER — MORPHINE SULFATE (PF) 4 MG/ML IV SOLN
4.0000 mg | Freq: Once | INTRAVENOUS | Status: AC
  Administered 2019-07-23: 01:00:00 4 mg via INTRAVENOUS
  Filled 2019-07-23: qty 1

## 2019-07-23 MED ORDER — KETOROLAC TROMETHAMINE 30 MG/ML IJ SOLN
30.0000 mg | Freq: Once | INTRAMUSCULAR | Status: AC
  Administered 2019-07-23: 01:00:00 30 mg via INTRAVENOUS
  Filled 2019-07-23: qty 1

## 2019-07-23 MED ORDER — ONDANSETRON HCL 4 MG/2ML IJ SOLN
4.0000 mg | Freq: Once | INTRAMUSCULAR | Status: AC
  Administered 2019-07-23: 01:00:00 4 mg via INTRAVENOUS
  Filled 2019-07-23: qty 2

## 2019-07-23 NOTE — Discharge Instructions (Signed)
Percocet as prescribed as needed for pain.  Follow-up with urology if your symptoms or not resolving in the next 3 to 4 days, and return to the ER for worsening pain, high fever, or other new and concerning symptoms.
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
## 2019-07-23 NOTE — Telephone Encounter (Signed)
EDCM returned call to pt wanting his Rx called in to a closer pharmacy.  Pt picked up Rx prior to [facility] calling [facility] because [facility] Pharmacists advised that getting location switched may take a day or two.

## 2021-02-06 ENCOUNTER — Ambulatory Visit (HOSPITAL_COMMUNITY)
Admission: EM | Admit: 2021-02-06 | Discharge: 2021-02-06 | Disposition: A | Discharge status: Home / Self Care | Payer: Self-pay | Attending: Emergency Medicine | Admitting: Emergency Medicine

## 2021-02-06 ENCOUNTER — Encounter (HOSPITAL_COMMUNITY): Payer: Self-pay | Admitting: Emergency Medicine

## 2021-02-06 ENCOUNTER — Other Ambulatory Visit: Payer: Self-pay

## 2021-02-06 DIAGNOSIS — L0291 Cutaneous abscess, unspecified: Secondary | ICD-10-CM

## 2021-02-06 MED ORDER — LIDOCAINE-EPINEPHRINE 1 %-1:100000 IJ SOLN
INTRAMUSCULAR | Status: AC
  Filled 2021-02-06: qty 1

## 2021-02-06 MED ORDER — DOXYCYCLINE HYCLATE 100 MG PO CAPS: 100.0000 mg | ORAL_CAPSULE | Freq: Two times a day (BID) | ORAL | 0 refills | Status: AC

## 2021-02-06 MED ORDER — HIBICLENS 4 % EX LIQD: Freq: Every day | CUTANEOUS | 0 refills | Status: DC | PRN

## 2021-02-06 MED ORDER — IBUPROFEN 600 MG PO TABS: 600.0000 mg | ORAL_TABLET | Freq: Four times a day (QID) | ORAL | 0 refills | Status: DC | PRN
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
## 2021-02-06 NOTE — ED Triage Notes (Signed)
Pt presents with large abscess on right jaw that first began about 2 weeks ago

## 2021-02-06 NOTE — Discharge Instructions (Addendum)
Return here or follow up with your doctor in 2 days for a wound check if not getting significantly better. Give [place] a working phone number so that we contact you if we need to change your antibiotics. Take the medication as written. Take 1 gram of tylenol with the motrin up to 4 times a day as needed for pain .  Warm compresses.  Hibiclens soap to keep it clean.. Return to the ER if you get worse, have a persistent fever >100.4, or for any other concerns.   Below is a list of primary care practices who are taking new patients for you to follow-up with.  [facility] internal medicine clinic Ground Floor - [facility], [address] [place], [place], [place] [address] [phone]  [facility] at [facility] [address] [place], [place] [address] [phone]  [facility] [address] [place], [place] [address] [phone]  [facility] [phone] [address] [place] [address]  [facility]: [address] [place] [address]  [phone]  [facility]: [address] [place] [address]   [phone]  [facility]: [address] [place] [address]  [phone]  [facility] : [address] [place] [address] [phone]  [facility]: [address] [place] [address] [phone]  [facility]: [address] [place] [address] [phone]  Dr. Oneal Grout [address] [facility] [place] [address]  [phone]  Go to www.goodrx.com to look up your medications. This will give you a list of where you can find your prescriptions at the most affordable prices. Or ask the pharmacist what the cash price is, or if  they have any other discount programs available to help make your medication more affordable. This can be less expensive than what you would pay with insurance.

## 2021-02-08 LAB — AEROBIC CULTURE W GRAM STAIN (SUPERFICIAL SPECIMEN): Culture: NO GROWTH

## 2021-04-27 ENCOUNTER — Emergency Department (HOSPITAL_COMMUNITY)
Admission: EM | Admit: 2021-04-27 | Discharge: 2021-04-27 | Disposition: A | Discharge status: Home / Self Care | Payer: 59 | Attending: Emergency Medicine | Admitting: Emergency Medicine

## 2021-04-27 ENCOUNTER — Other Ambulatory Visit: Payer: Self-pay

## 2021-04-27 ENCOUNTER — Encounter (HOSPITAL_COMMUNITY): Payer: Self-pay

## 2021-04-27 ENCOUNTER — Emergency Department (HOSPITAL_COMMUNITY): Payer: 59

## 2021-04-27 DIAGNOSIS — N200 Calculus of kidney: Secondary | ICD-10-CM

## 2021-04-27 DIAGNOSIS — R1031 Right lower quadrant pain: Secondary | ICD-10-CM | POA: Diagnosis present

## 2021-04-27 DIAGNOSIS — F17290 Nicotine dependence, other tobacco product, uncomplicated: Secondary | ICD-10-CM | POA: Diagnosis not present

## 2021-04-27 LAB — CBC WITH DIFFERENTIAL/PLATELET
Abs Immature Granulocytes: 0.03 10*3/uL (ref 0.00–0.07)
Basophils Absolute: 0.1 10*3/uL (ref 0.0–0.1)
Basophils Relative: 0 %
Eosinophils Absolute: 0.2 10*3/uL (ref 0.0–0.5)
Eosinophils Relative: 1 %
HCT: 42.4 % (ref 39.0–52.0)
Hemoglobin: 14 g/dL (ref 13.0–17.0)
Immature Granulocytes: 0 %
Lymphocytes Relative: 52 %
Lymphs Abs: 6.6 10*3/uL — ABNORMAL HIGH (ref 0.7–4.0)
MCH: 30.2 pg (ref 26.0–34.0)
MCHC: 33 g/dL (ref 30.0–36.0)
MCV: 91.6 fL (ref 80.0–100.0)
Monocytes Absolute: 1.3 10*3/uL — ABNORMAL HIGH (ref 0.1–1.0)
Monocytes Relative: 10 %
Neutro Abs: 4.7 10*3/uL (ref 1.7–7.7)
Neutrophils Relative %: 37 %
Platelets: 267 10*3/uL (ref 150–400)
RBC: 4.63 MIL/uL (ref 4.22–5.81)
RDW: 15.3 % (ref 11.5–15.5)
WBC: 12.9 10*3/uL — ABNORMAL HIGH (ref 4.0–10.5)
nRBC: 0 % (ref 0.0–0.2)

## 2021-04-27 LAB — BASIC METABOLIC PANEL
Anion gap: 7 (ref 5–15)
BUN: 19 mg/dL (ref 6–20)
CO2: 23 mmol/L (ref 22–32)
Calcium: 8.7 mg/dL — ABNORMAL LOW (ref 8.9–10.3)
Chloride: 108 mmol/L (ref 98–111)
Creatinine, Ser: 1.01 mg/dL (ref 0.61–1.24)
GFR, Estimated: >60 mL/min (ref >60)
Glucose, Bld: 136 mg/dL — ABNORMAL HIGH (ref 70–99)
Potassium: 4.1 mmol/L (ref 3.5–5.1)
Sodium: 138 mmol/L (ref 135–145)

## 2021-04-27 MED ORDER — ONDANSETRON HCL 4 MG/2ML IJ SOLN
4.0000 mg | Freq: Once | INTRAMUSCULAR | Status: AC
  Administered 2021-04-27: 4 mg via INTRAVENOUS
  Filled 2021-04-27: qty 2

## 2021-04-27 MED ORDER — MORPHINE SULFATE (PF) 4 MG/ML IV SOLN
4.0000 mg | Freq: Once | INTRAVENOUS | Status: AC
  Administered 2021-04-27: 4 mg via INTRAVENOUS
  Filled 2021-04-27: qty 1

## 2021-04-27 MED ORDER — TAMSULOSIN HCL 0.4 MG PO CAPS: 0.4000 mg | ORAL_CAPSULE | Freq: Every day | ORAL | 0 refills | Status: AC

## 2021-04-27 MED ORDER — OXYCODONE-ACETAMINOPHEN 5-325 MG PO TABS: 1.0000 | ORAL_TABLET | Freq: Four times a day (QID) | ORAL | 0 refills | Status: DC | PRN

## 2021-04-27 MED ORDER — KETOROLAC TROMETHAMINE 30 MG/ML IJ SOLN
30.0000 mg | Freq: Once | INTRAMUSCULAR | Status: AC
  Administered 2021-04-27: 30 mg via INTRAVENOUS
  Filled 2021-04-27: qty 1

## 2021-04-27 NOTE — ED Notes (Signed)
Pt verbalized understanding of d/c, medication, and follow up care. Ambulatory with steady gait.  

## 2021-04-27 NOTE — Discharge Instructions (Addendum)
Take Percocet as prescribed as needed for pain.  Begin taking Flomax as prescribed.  Follow-up with urology if the stone has not passed in the next 3 days.  The contact information for [facility] has been provided in this discharge summary for you to call and make these arrangements.  Return to the emergency department if you develop high fever, worsening pain, or other new and concerning symptoms.
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
## 2021-04-30 ENCOUNTER — Other Ambulatory Visit: Payer: Self-pay

## 2021-04-30 ENCOUNTER — Emergency Department (HOSPITAL_COMMUNITY)
Admission: EM | Admit: 2021-04-30 | Discharge: 2021-04-30 | Disposition: A | Discharge status: Home / Self Care | Payer: 59 | Attending: Emergency Medicine | Admitting: Emergency Medicine

## 2021-04-30 ENCOUNTER — Emergency Department (HOSPITAL_COMMUNITY): Payer: 59

## 2021-04-30 ENCOUNTER — Encounter (HOSPITAL_COMMUNITY): Payer: Self-pay | Admitting: Emergency Medicine

## 2021-04-30 DIAGNOSIS — R109 Unspecified abdominal pain: Secondary | ICD-10-CM | POA: Diagnosis present

## 2021-04-30 DIAGNOSIS — N200 Calculus of kidney: Secondary | ICD-10-CM

## 2021-04-30 DIAGNOSIS — F17210 Nicotine dependence, cigarettes, uncomplicated: Secondary | ICD-10-CM | POA: Diagnosis not present

## 2021-04-30 LAB — BASIC METABOLIC PANEL
Anion gap: 6 (ref 5–15)
BUN: 18 mg/dL (ref 6–20)
CO2: 25 mmol/L (ref 22–32)
Calcium: 9 mg/dL (ref 8.9–10.3)
Chloride: 106 mmol/L (ref 98–111)
Creatinine, Ser: 1.11 mg/dL (ref 0.61–1.24)
GFR, Estimated: >60 mL/min (ref >60)
Glucose, Bld: 110 mg/dL — ABNORMAL HIGH (ref 70–99)
Potassium: 3.8 mmol/L (ref 3.5–5.1)
Sodium: 137 mmol/L (ref 135–145)

## 2021-04-30 LAB — CBC WITH DIFFERENTIAL/PLATELET
Abs Immature Granulocytes: 0.03 10*3/uL (ref 0.00–0.07)
Basophils Absolute: 0 10*3/uL (ref 0.0–0.1)
Basophils Relative: 0 %
Eosinophils Absolute: 0.1 10*3/uL (ref 0.0–0.5)
Eosinophils Relative: 1 %
HCT: 38.5 % — ABNORMAL LOW (ref 39.0–52.0)
Hemoglobin: 12.8 g/dL — ABNORMAL LOW (ref 13.0–17.0)
Immature Granulocytes: 0 %
Lymphocytes Relative: 16 %
Lymphs Abs: 1.9 10*3/uL (ref 0.7–4.0)
MCH: 30.1 pg (ref 26.0–34.0)
MCHC: 33.2 g/dL (ref 30.0–36.0)
MCV: 90.6 fL (ref 80.0–100.0)
Monocytes Absolute: 1.9 10*3/uL — ABNORMAL HIGH (ref 0.1–1.0)
Monocytes Relative: 16 %
Neutro Abs: 8 10*3/uL — ABNORMAL HIGH (ref 1.7–7.7)
Neutrophils Relative %: 67 %
Platelets: 225 10*3/uL (ref 150–400)
RBC: 4.25 MIL/uL (ref 4.22–5.81)
RDW: 15.4 % (ref 11.5–15.5)
WBC: 11.8 10*3/uL — ABNORMAL HIGH (ref 4.0–10.5)
nRBC: 0 % (ref 0.0–0.2)

## 2021-04-30 LAB — URINALYSIS, ROUTINE W REFLEX MICROSCOPIC
Bacteria, UA: NONE SEEN
Bilirubin Urine: NEGATIVE
Glucose, UA: NEGATIVE mg/dL
Ketones, ur: 20 mg/dL — AB
Leukocytes,Ua: NEGATIVE
Nitrite: NEGATIVE
Protein, ur: NEGATIVE mg/dL
Specific Gravity, Urine: 1.016 (ref 1.005–1.030)
pH: 5 (ref 5.0–8.0)

## 2021-04-30 MED ORDER — ONDANSETRON HCL 4 MG/2ML IJ SOLN
4.0000 mg | Freq: Once | INTRAMUSCULAR | Status: AC
  Administered 2021-04-30: 4 mg via INTRAVENOUS
  Filled 2021-04-30: qty 2

## 2021-04-30 MED ORDER — OXYCODONE-ACETAMINOPHEN 10-325 MG PO TABS: 1.0000 | ORAL_TABLET | ORAL | 0 refills | Status: AC | PRN

## 2021-04-30 MED ORDER — KETOROLAC TROMETHAMINE 30 MG/ML IJ SOLN
30.0000 mg | Freq: Once | INTRAMUSCULAR | Status: AC
  Administered 2021-04-30: 30 mg via INTRAVENOUS
  Filled 2021-04-30: qty 1

## 2021-04-30 MED ORDER — ONDANSETRON 4 MG PO TBDP: 4.0000 mg | ORAL_TABLET | Freq: Three times a day (TID) | ORAL | 0 refills | Status: AC | PRN

## 2021-04-30 MED ORDER — SODIUM CHLORIDE 0.9 % IV BOLUS
1000.0000 mL | Freq: Once | INTRAVENOUS | Status: AC
  Administered 2021-04-30: 1000 mL via INTRAVENOUS

## 2021-04-30 MED ORDER — HYDROMORPHONE HCL 1 MG/ML IJ SOLN
0.5000 mg | Freq: Once | INTRAMUSCULAR | Status: AC
  Administered 2021-04-30: 0.5 mg via INTRAVENOUS
  Filled 2021-04-30: qty 1

## 2021-04-30 NOTE — Discharge Instructions (Signed)
It was a pleasure taking care of you here in the emergency department  I have written you for some additional pain medication.  Do not take the prior prescriptions you were given.  This medication may become addictive.  Taking more than prescribed may cause respiratory depression and death, therefore only take as prescribed.  You may take additional anti-inflammatories such as ibuprofen.  Have also written you for some nausea medicine.  Continue taking the Flomax  Follow up with urology.  Their contact information is listed on your discharge paperwork.  You will need to call to schedule an appointment  Return for new or worsening symptoms

## 2021-04-30 NOTE — ED Triage Notes (Signed)
Patient complaining of right flank pain and he states he came here a few days ago. Patient states the pain medication and antibiotic is not working.
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
## 2021-04-30 NOTE — ED Notes (Signed)
Pt in bed resting, respirations even and unlabored. Family at bedside 

## 2021-05-01 LAB — URINE CULTURE: Culture: NO GROWTH

## 2021-05-02 ENCOUNTER — Other Ambulatory Visit: Payer: Self-pay

## 2021-05-02 ENCOUNTER — Emergency Department (HOSPITAL_COMMUNITY)
Admission: EM | Admit: 2021-05-02 | Discharge: 2021-05-02 | Disposition: A | Discharge status: Home / Self Care | Payer: 59 | Attending: Emergency Medicine | Admitting: Emergency Medicine

## 2021-05-02 DIAGNOSIS — R110 Nausea: Secondary | ICD-10-CM | POA: Insufficient documentation

## 2021-05-02 DIAGNOSIS — Z5321 Procedure and treatment not carried out due to patient leaving prior to being seen by health care provider: Secondary | ICD-10-CM | POA: Insufficient documentation

## 2021-05-02 DIAGNOSIS — R109 Unspecified abdominal pain: Secondary | ICD-10-CM | POA: Diagnosis present

## 2021-05-02 DIAGNOSIS — R319 Hematuria, unspecified: Secondary | ICD-10-CM | POA: Insufficient documentation

## 2021-05-02 LAB — CBC WITH DIFFERENTIAL/PLATELET
Abs Immature Granulocytes: 0.03 10*3/uL (ref 0.00–0.07)
Basophils Absolute: 0 10*3/uL (ref 0.0–0.1)
Basophils Relative: 0 %
Eosinophils Absolute: 0.1 10*3/uL (ref 0.0–0.5)
Eosinophils Relative: 1 %
HCT: 37.6 % — ABNORMAL LOW (ref 39.0–52.0)
Hemoglobin: 12.5 g/dL — ABNORMAL LOW (ref 13.0–17.0)
Immature Granulocytes: 0 %
Lymphocytes Relative: 29 %
Lymphs Abs: 3 10*3/uL (ref 0.7–4.0)
MCH: 30.3 pg (ref 26.0–34.0)
MCHC: 33.2 g/dL (ref 30.0–36.0)
MCV: 91 fL (ref 80.0–100.0)
Monocytes Absolute: 1.5 10*3/uL — ABNORMAL HIGH (ref 0.1–1.0)
Monocytes Relative: 15 %
Neutro Abs: 5.6 10*3/uL (ref 1.7–7.7)
Neutrophils Relative %: 55 %
Platelets: 258 10*3/uL (ref 150–400)
RBC: 4.13 MIL/uL — ABNORMAL LOW (ref 4.22–5.81)
RDW: 14.9 % (ref 11.5–15.5)
WBC: 10.3 10*3/uL (ref 4.0–10.5)
nRBC: 0 % (ref 0.0–0.2)

## 2021-05-02 LAB — URINALYSIS, ROUTINE W REFLEX MICROSCOPIC
Bacteria, UA: NONE SEEN
Bilirubin Urine: NEGATIVE
Glucose, UA: NEGATIVE mg/dL
Ketones, ur: NEGATIVE mg/dL
Leukocytes,Ua: NEGATIVE
Nitrite: NEGATIVE
Protein, ur: NEGATIVE mg/dL
RBC / HPF: >50 RBC/hpf — ABNORMAL HIGH (ref 0–5)
Specific Gravity, Urine: 1.017 (ref 1.005–1.030)
pH: 7 (ref 5.0–8.0)

## 2021-05-02 LAB — COMPREHENSIVE METABOLIC PANEL
ALT: 16 U/L (ref 0–44)
AST: 14 U/L — ABNORMAL LOW (ref 15–41)
Albumin: 3.7 g/dL (ref 3.5–5.0)
Alkaline Phosphatase: 70 U/L (ref 38–126)
Anion gap: 5 (ref 5–15)
BUN: 15 mg/dL (ref 6–20)
CO2: 26 mmol/L (ref 22–32)
Calcium: 8.6 mg/dL — ABNORMAL LOW (ref 8.9–10.3)
Chloride: 110 mmol/L (ref 98–111)
Creatinine, Ser: 0.96 mg/dL (ref 0.61–1.24)
GFR, Estimated: >60 mL/min (ref >60)
Glucose, Bld: 83 mg/dL (ref 70–99)
Potassium: 3.3 mmol/L — ABNORMAL LOW (ref 3.5–5.1)
Sodium: 141 mmol/L (ref 135–145)
Total Bilirubin: 0.3 mg/dL (ref 0.3–1.2)
Total Protein: 6.5 g/dL (ref 6.5–8.1)

## 2021-05-02 LAB — LIPASE, BLOOD: Lipase: 23 U/L (ref 11–51)

## 2021-05-02 MED ORDER — ONDANSETRON HCL 4 MG/2ML IJ SOLN
4.0000 mg | Freq: Once | INTRAMUSCULAR | Status: AC
  Administered 2021-05-02: 4 mg via INTRAVENOUS
  Filled 2021-05-02: qty 2

## 2021-05-02 MED ORDER — KETOROLAC TROMETHAMINE 30 MG/ML IJ SOLN
30.0000 mg | Freq: Once | INTRAMUSCULAR | Status: AC
  Administered 2021-05-02: 30 mg via INTRAVENOUS
  Filled 2021-05-02: qty 1

## 2021-05-02 NOTE — ED Triage Notes (Signed)
Pt came from work via EMS. Pt reports right sided flank pain that radiates to right side of groin that started a week ago, been seen twice for this problem but pain unbearable at work today. Prescribed percocet for pain, but unrelieved last night after taking 2 percocet.  140/100 100% on RA 72 18 in right FA Pain 10/10 Pain 5/10 after fentanyl admin Hematuria last night Ambulatory with assistance with EMS.

## 2021-05-02 NOTE — ED Notes (Signed)
Pt told registration staff he does not want to stay and wants IV removed

## 2021-05-02 NOTE — ED Provider Notes (Signed)
Emergency Medicine Provider Triage Evaluation Note  Jesse Conner , a 40 y.o. male  was evaluated in triage.  Pt complains of R flank pain.  Review of Systems  Positive: R flank pain, hematuria, nausea Negative: Fever, cp, sob  Physical Exam  BP 140/85 (BP Location: Right Arm)   Pulse 72   Temp 98.6 F (37 C) (Oral)   Resp 16   Ht 5\' 9"  (1.753 m)   Wt 74.8 kg   SpO2 98%   BMI 24.37 kg/m  Gen:   Awake, appears uncomfortable Resp:  Normal effort  MSK:   Moves extremities without difficulty  Other:  R flank tenderness, RLQ tenderness  Medical Decision Making  Medically screening exam initiated at 6:15 PM.  Appropriate orders placed.  Jesse Conner was informed that the remainder of the evaluation will be completed by another provider, this initial triage assessment does not replace that evaluation, and the importance of remaining in the ED until their evaluation is complete.  Recently diagnosed with 57mm nephrolithiasis to R UPJ on 5/14 now here with worsening pain.  Have contacted urology but appointment time isn't soon enough.    6/14, PA-C 05/02/21 1817    05/04/21, MD 05/02/21 [phone]

## 2021-05-19 ENCOUNTER — Other Ambulatory Visit: Payer: Self-pay

## 2021-05-19 ENCOUNTER — Encounter (HOSPITAL_COMMUNITY): Payer: Self-pay

## 2021-05-19 ENCOUNTER — Ambulatory Visit (HOSPITAL_COMMUNITY)
Admission: EM | Admit: 2021-05-19 | Discharge: 2021-05-19 | Disposition: A | Discharge status: Home / Self Care | Payer: 59 | Attending: Internal Medicine | Admitting: Internal Medicine

## 2021-05-19 DIAGNOSIS — L0201 Cutaneous abscess of face: Secondary | ICD-10-CM | POA: Diagnosis not present

## 2021-05-19 MED ORDER — IBUPROFEN 600 MG PO TABS: 600.0000 mg | ORAL_TABLET | Freq: Four times a day (QID) | ORAL | 0 refills | Status: DC | PRN

## 2021-05-19 MED ORDER — SULFAMETHOXAZOLE-TRIMETHOPRIM 800-160 MG PO TABS: 1.0000 | ORAL_TABLET | Freq: Two times a day (BID) | ORAL | 0 refills | Status: AC

## 2021-05-19 NOTE — ED Triage Notes (Signed)
Pt presents with abscess on both sides of face along jaw line X 4 days; the one on left side causing pain.

## 2021-05-19 NOTE — Discharge Instructions (Signed)
Continue warm compress Take antibiotics as prescribed Follow-up with the general surgeon for formal incision and drainage Given the location and the extent of the swelling on your face, general surgery referral for incision and drainage is more appropriate.
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
## 2022-04-14 ENCOUNTER — Other Ambulatory Visit: Payer: Self-pay

## 2022-04-14 ENCOUNTER — Emergency Department (HOSPITAL_COMMUNITY): Payer: No Typology Code available for payment source

## 2022-04-14 ENCOUNTER — Encounter (HOSPITAL_COMMUNITY): Payer: Self-pay

## 2022-04-14 ENCOUNTER — Emergency Department (HOSPITAL_COMMUNITY)
Admission: EM | Admit: 2022-04-14 | Discharge: 2022-04-14 | Disposition: A | Discharge status: Home / Self Care | Payer: No Typology Code available for payment source | Attending: Emergency Medicine | Admitting: Emergency Medicine

## 2022-04-14 DIAGNOSIS — S300XXA Contusion of lower back and pelvis, initial encounter: Secondary | ICD-10-CM | POA: Diagnosis not present

## 2022-04-14 DIAGNOSIS — W231XXA Caught, crushed, jammed, or pinched between stationary objects, initial encounter: Secondary | ICD-10-CM | POA: Diagnosis not present

## 2022-04-14 DIAGNOSIS — R0781 Pleurodynia: Secondary | ICD-10-CM | POA: Insufficient documentation

## 2022-04-14 DIAGNOSIS — Y92410 Unspecified street and highway as the place of occurrence of the external cause: Secondary | ICD-10-CM | POA: Insufficient documentation

## 2022-04-14 DIAGNOSIS — S3992XA Unspecified injury of lower back, initial encounter: Secondary | ICD-10-CM | POA: Diagnosis present

## 2022-04-14 LAB — COMPREHENSIVE METABOLIC PANEL
ALT: 23 U/L (ref 0–44)
AST: 20 U/L (ref 15–41)
Albumin: 4.6 g/dL (ref 3.5–5.0)
Alkaline Phosphatase: 87 U/L (ref 38–126)
Anion gap: 5 (ref 5–15)
BUN: 16 mg/dL (ref 6–20)
CO2: 26 mmol/L (ref 22–32)
Calcium: 9.9 mg/dL (ref 8.9–10.3)
Chloride: 110 mmol/L (ref 98–111)
Creatinine, Ser: 0.79 mg/dL (ref 0.61–1.24)
GFR, Estimated: >60 mL/min (ref >60)
Glucose, Bld: 75 mg/dL (ref 70–99)
Potassium: 4.1 mmol/L (ref 3.5–5.1)
Sodium: 141 mmol/L (ref 135–145)
Total Bilirubin: 0.8 mg/dL (ref 0.3–1.2)
Total Protein: 7.5 g/dL (ref 6.5–8.1)

## 2022-04-14 LAB — CBC WITH DIFFERENTIAL/PLATELET
Abs Immature Granulocytes: 0.01 10*3/uL (ref 0.00–0.07)
Basophils Absolute: 0 10*3/uL (ref 0.0–0.1)
Basophils Relative: 0 %
Eosinophils Absolute: 0.1 10*3/uL (ref 0.0–0.5)
Eosinophils Relative: 1 %
HCT: 43.7 % (ref 39.0–52.0)
Hemoglobin: 15 g/dL (ref 13.0–17.0)
Immature Granulocytes: 0 %
Lymphocytes Relative: 57 %
Lymphs Abs: 4.3 10*3/uL — ABNORMAL HIGH (ref 0.7–4.0)
MCH: 30.9 pg (ref 26.0–34.0)
MCHC: 34.3 g/dL (ref 30.0–36.0)
MCV: 89.9 fL (ref 80.0–100.0)
Monocytes Absolute: 0.9 10*3/uL (ref 0.1–1.0)
Monocytes Relative: 11 %
Neutro Abs: 2.4 10*3/uL (ref 1.7–7.7)
Neutrophils Relative %: 31 %
Platelets: 270 10*3/uL (ref 150–400)
RBC: 4.86 MIL/uL (ref 4.22–5.81)
RDW: 14.9 % (ref 11.5–15.5)
WBC: 7.6 10*3/uL (ref 4.0–10.5)
nRBC: 0 % (ref 0.0–0.2)

## 2022-04-14 MED ORDER — IOHEXOL 300 MG/ML  SOLN
100.0000 mL | Freq: Once | INTRAMUSCULAR | Status: AC | PRN
  Administered 2022-04-14: 100 mL via INTRAVENOUS

## 2022-04-14 MED ORDER — IBUPROFEN 600 MG PO TABS: 600.0000 mg | ORAL_TABLET | Freq: Four times a day (QID) | ORAL | 0 refills | Status: AC | PRN

## 2022-04-14 MED ORDER — CYCLOBENZAPRINE HCL 5 MG PO TABS: 5.0000 mg | ORAL_TABLET | Freq: Three times a day (TID) | ORAL | 0 refills | Status: AC | PRN

## 2022-04-14 MED ORDER — MORPHINE SULFATE (PF) 4 MG/ML IV SOLN
4.0000 mg | Freq: Once | INTRAVENOUS | Status: AC
  Administered 2022-04-14: 4 mg via INTRAVENOUS
  Filled 2022-04-14: qty 1

## 2022-04-14 MED ORDER — LACTATED RINGERS IV BOLUS
1000.0000 mL | Freq: Once | INTRAVENOUS | Status: AC
  Administered 2022-04-14: 1000 mL via INTRAVENOUS
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
## 2022-09-26 ENCOUNTER — Encounter (HOSPITAL_COMMUNITY): Payer: Self-pay

## 2022-09-26 ENCOUNTER — Emergency Department (HOSPITAL_COMMUNITY)
Admission: EM | Admit: 2022-09-26 | Discharge: 2022-09-26 | Disposition: A | Discharge status: Home / Self Care | Payer: Self-pay | Attending: Emergency Medicine | Admitting: Emergency Medicine

## 2022-09-26 DIAGNOSIS — L0201 Cutaneous abscess of face: Secondary | ICD-10-CM | POA: Insufficient documentation

## 2022-09-26 MED ORDER — DOXYCYCLINE HYCLATE 100 MG PO CAPS: 100.0000 mg | ORAL_CAPSULE | Freq: Two times a day (BID) | ORAL | 0 refills | Status: AC

## 2022-09-26 NOTE — ED Triage Notes (Signed)
Pt arrived via POV, c/o right sided jaw pain for a few days.

## 2022-09-26 NOTE — ED Provider Notes (Signed)
[facility]-EMERGENCY DEPT Provider Note   CSN: [id number] Arrival date & time: 09/26/22  0865     History  Chief Complaint  Patient presents with   Facial Swelling    Jesse Conner is a 41 y.o. male with history of recurring facial abscesses presented ED with 4 days of swelling along the right side of his jawline.  He feels this is another abscess developing.  No fevers or chills.  Is able to eat normally fully flex and extend his jaw.  HPI     Home Medications Prior to Admission medications   Medication Sig Start Date End Date Taking? Authorizing Provider  doxycycline (VIBRAMYCIN) 100 MG capsule Take 1 capsule (100 mg total) by mouth 2 (two) times daily for 7 days. 09/26/22 10/03/22 Yes Gessica Jawad, Kermit Balo, MD  cyclobenzaprine (FLEXERIL) 5 MG tablet Take 1 tablet (5 mg total) by mouth 3 (three) times daily as needed for muscle spasms. 04/14/22   Charlynne Pander, MD  ibuprofen (ADVIL) 600 MG tablet Take 1 tablet (600 mg total) by mouth every 6 (six) hours as needed. 04/14/22   Charlynne Pander, MD  ondansetron (ZOFRAN ODT) 4 MG disintegrating tablet Take 1 tablet (4 mg total) by mouth every 8 (eight) hours as needed for nausea or vomiting. 04/30/21   Henderly, Britni A, PA-C  oxyCODONE-acetaminophen (PERCOCET) 10-325 MG tablet Take 1 tablet by mouth every 4 (four) hours as needed for pain. 04/30/21   Henderly, Britni A, PA-C  tamsulosin (FLOMAX) 0.4 MG CAPS capsule Take 1 capsule (0.4 mg total) by mouth daily. 04/27/21   Geoffery Lyons, MD      Allergies    Patient has no known allergies.    Review of Systems   Review of Systems  Physical Exam Updated Vital Signs BP (!) 141/90 (BP Location: Left Arm)   Pulse 70   Temp 97.9 F (36.6 C) (Oral)   Resp 18   SpO2 100%  Physical Exam Constitutional:      General: He is not in acute distress. HENT:     Head: Normocephalic and atraumatic.  Eyes:     Conjunctiva/sclera: Conjunctivae normal.     Pupils:  Pupils are equal, round, and reactive to light.  Cardiovascular:     Rate and Rhythm: Normal rate and regular rhythm.  Pulmonary:     Effort: Pulmonary effort is normal. No respiratory distress.  Abdominal:     General: There is no distension.     Tenderness: There is no abdominal tenderness.  Skin:    General: Skin is warm and dry.     Comments: Small region of induration along the right lower mandible, no fluctuance of the parotid gland  Neurological:     General: No focal deficit present.     Mental Status: He is alert. Mental status is at baseline.  Psychiatric:        Mood and Affect: Mood normal.        Behavior: Behavior normal.     ED Results / Procedures / Treatments   Labs (all labs ordered are listed, but only abnormal results are displayed) Labs Reviewed - No data to display  EKG None  Radiology No results found.  Procedures Procedures    Medications Ordered in ED Medications - No data to display  ED Course/ Medical Decision Making/ A&P  Medical Decision Making Risk Prescription drug management.   Patient is here with potential early abscess developing along the right jawline.  This is quite a small region of induration, less than 1 cm.  Does not appear to be parotitis.  Do not see evidence of Ludewig's angina or deep space tracking infection.  Patient would prefer antibiotic treatment over bedside I&D, and he has has been treated extensively with antibiotics in the past. I do believe that we caught this early enough that it would be reasonable to trial a course of antibiotics.  7 days of doxycycline prescribed.  We will pick him up from the pharmacy and begin taking them immediately this morning.  He will continue with warm compresses at home as well as taking Aleve and Tylenol as needed.  Okay for discharge        Final Clinical Impression(s) / ED Diagnoses Final diagnoses:  Facial abscess    Rx / DC Orders ED  Discharge Orders          Ordered    doxycycline (VIBRAMYCIN) 100 MG capsule  2 times daily        09/26/22 0906              Wyvonnia Dusky, MD 09/26/22 [phone]
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
## 2022-10-12 IMAGING — US US RENAL
1 series · 15 of 25 positions shown · non-contrast
Comparison: CT AP 04/27/2021

CLINICAL DATA: Right flank pain.

EXAM:
RENAL / URINARY TRACT ULTRASOUND COMPLETE

[Series 1: us renal mc & wl · 15 of 28 slices shown]
[im 1/28]
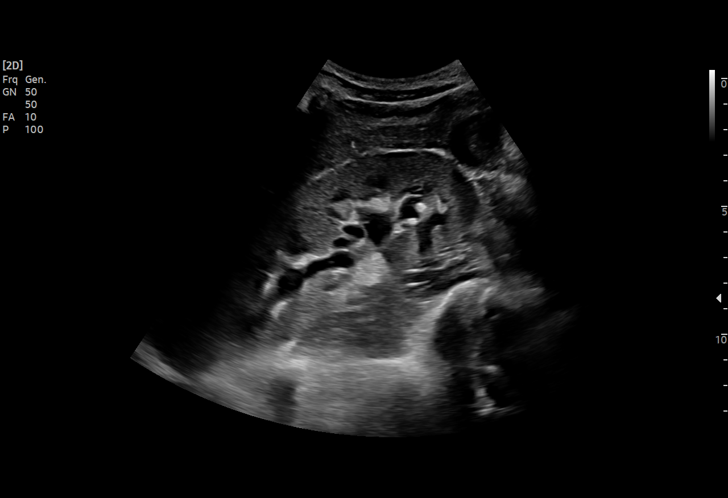
[im 3/28]
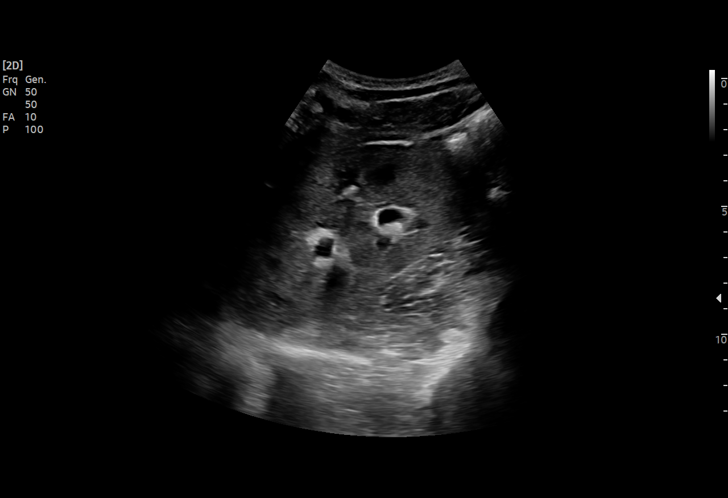
[im 5/28]
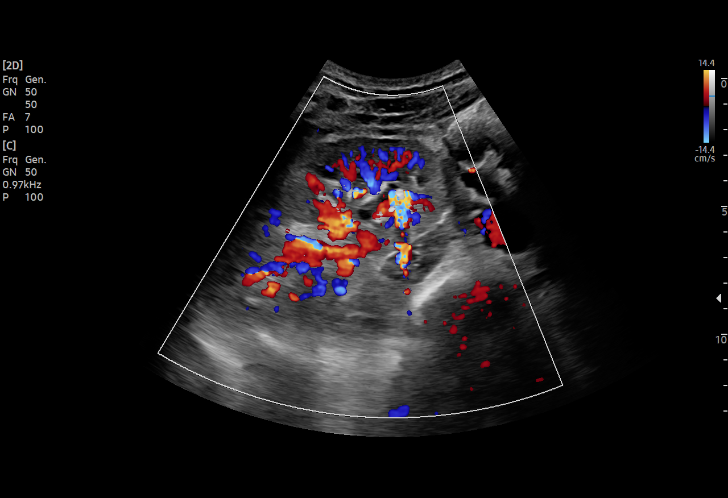
[im 6/28]
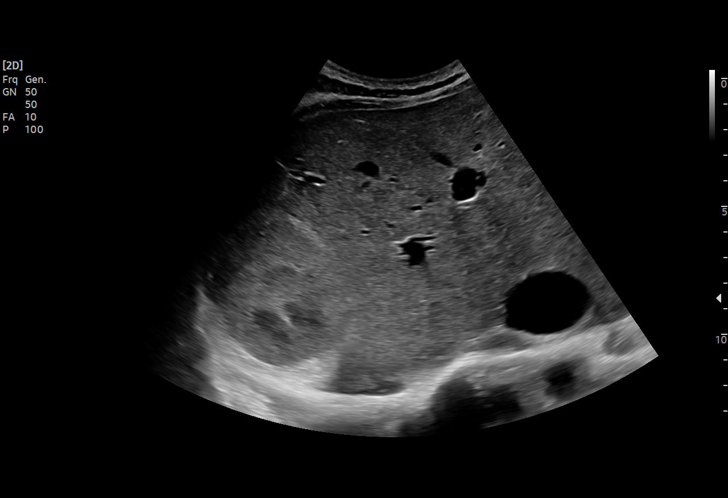
[im 8/28]
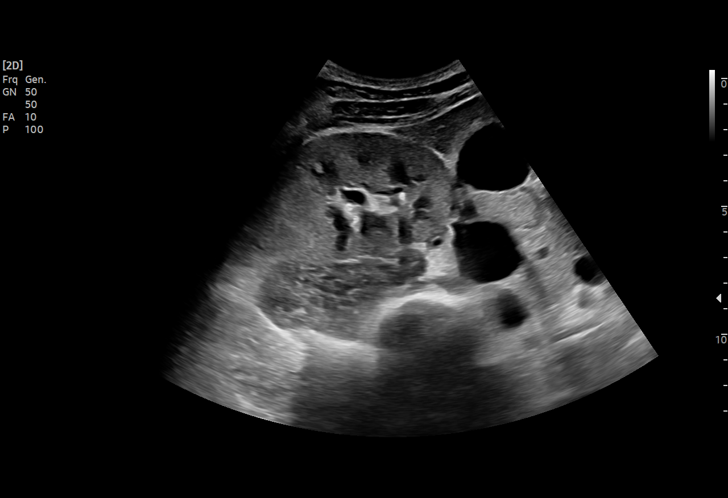
[im 11/28]
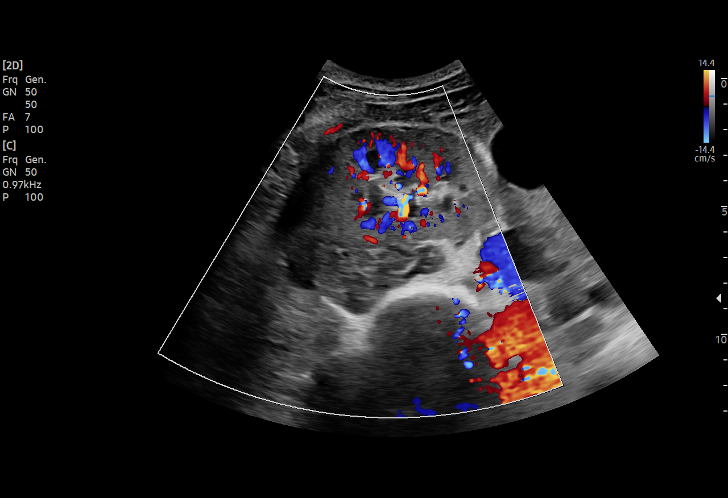
[im 12/28]
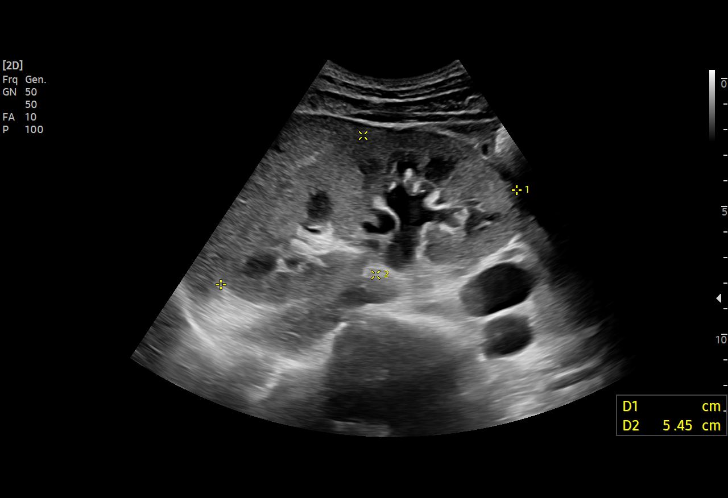
[im 14/28]
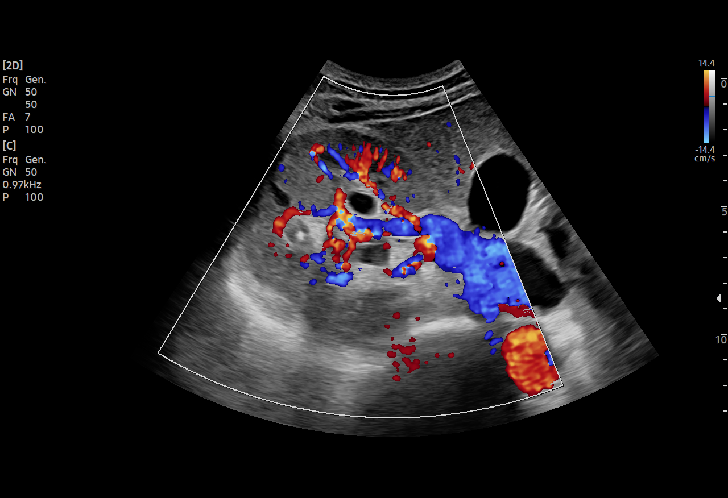
[im 16/28]
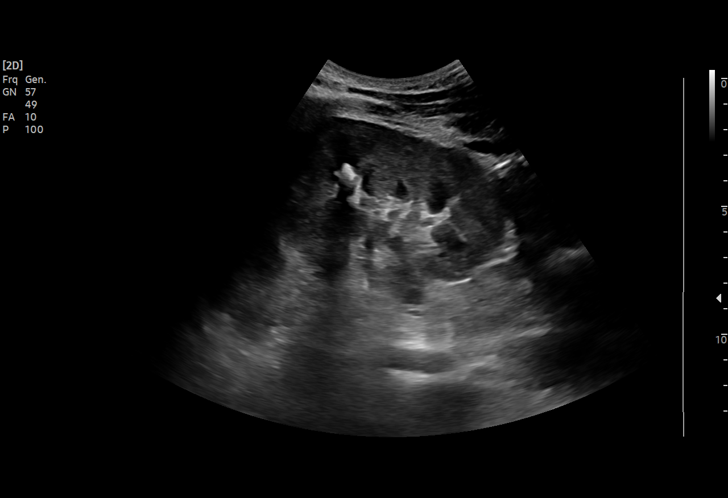
[im 17/28]
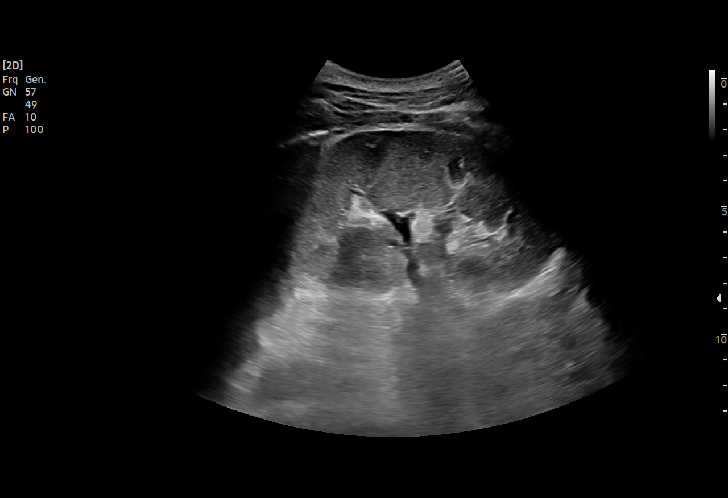
[im 20/28]
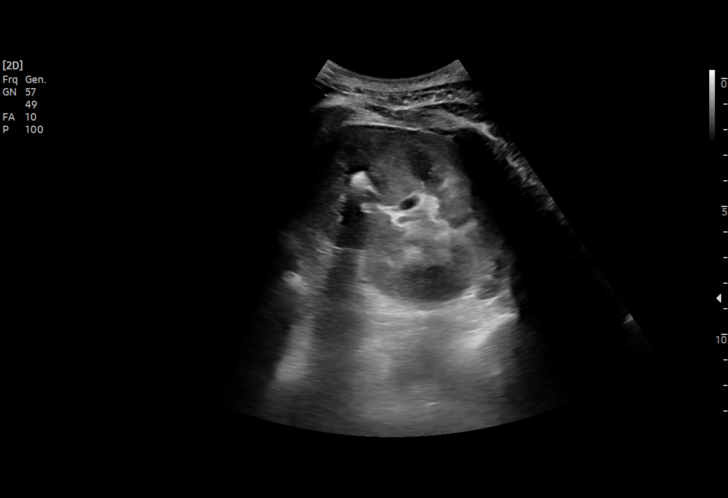
[im 22/28]
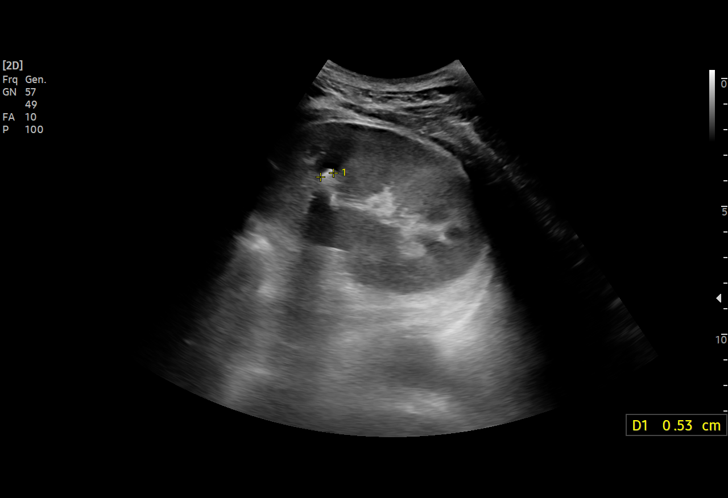
[im 23/28]
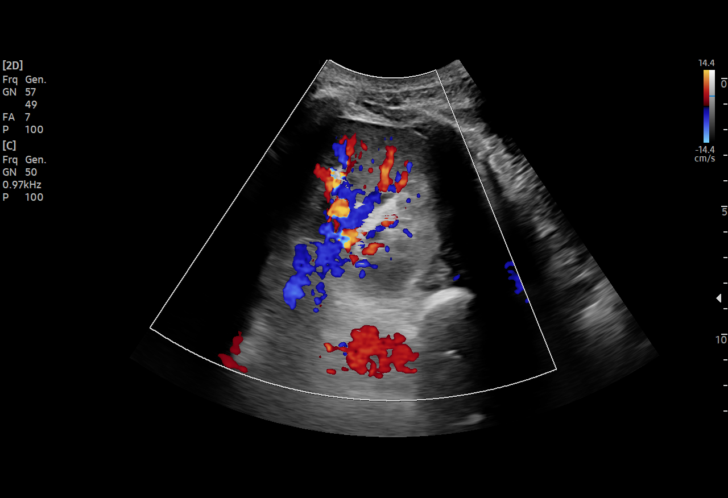
[im 25/28]
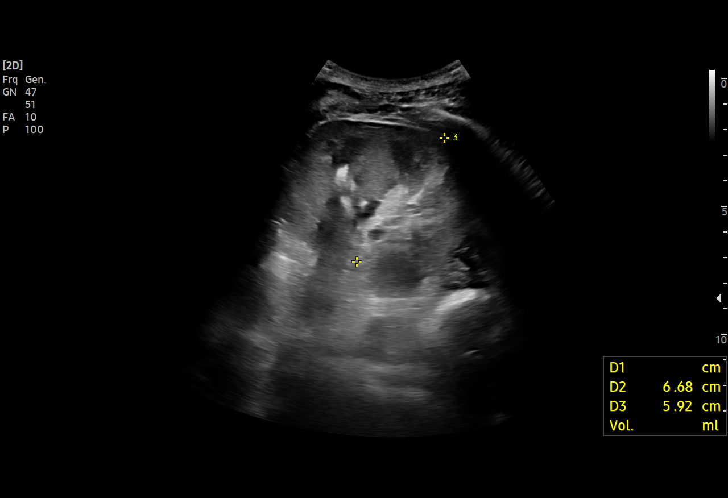
[im 28/28]
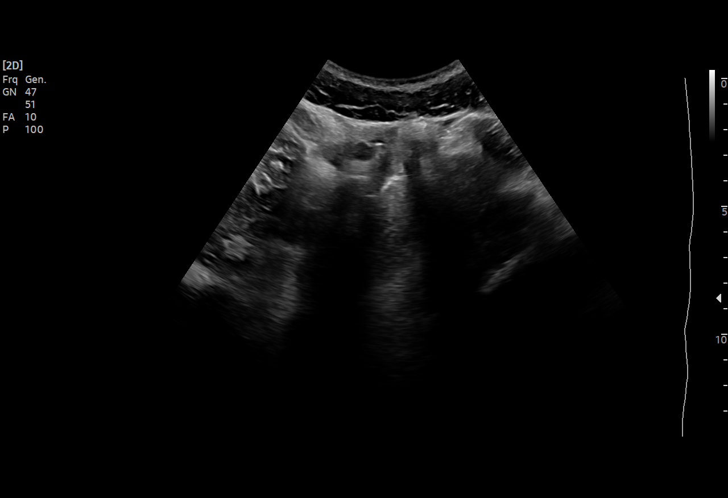

[15 of 25 positions shown; findings below may reference images not displayed]

FINDINGS: Right Kidney:

Renal measurements: 12.1 x 5.5 x 5.0 cm = volume: 173 mL. Mild
hydronephrosis. Multiple right renal calculi are again noted. The
largest is in the inferior pole measuring 8 mm.

Left Kidney:

Renal measurements: 12.0 x 6.7 x 5.9 cm = volume: 249 mL.
Echogenicity within normal limits. No mass or hydronephrosis
visualized. Left renal calculi are again noted measuring up to 5 mm.

Bladder:

Appears collapsed.

Other:

None.
IMPRESSION: 1. Mild right hydronephrosis.
2. Bilateral nephrolithiasis.

## 2022-11-07 ENCOUNTER — Emergency Department (HOSPITAL_BASED_OUTPATIENT_CLINIC_OR_DEPARTMENT_OTHER): Payer: Self-pay | Admitting: Radiology

## 2022-11-07 ENCOUNTER — Encounter (HOSPITAL_BASED_OUTPATIENT_CLINIC_OR_DEPARTMENT_OTHER): Payer: Self-pay

## 2022-11-07 ENCOUNTER — Other Ambulatory Visit: Payer: Self-pay

## 2022-11-07 ENCOUNTER — Emergency Department (HOSPITAL_BASED_OUTPATIENT_CLINIC_OR_DEPARTMENT_OTHER)
Admission: EM | Admit: 2022-11-07 | Discharge: 2022-11-07 | Disposition: A | Discharge status: Home / Self Care | Payer: Self-pay | Attending: Emergency Medicine | Admitting: Emergency Medicine

## 2022-11-07 DIAGNOSIS — S0990XA Unspecified injury of head, initial encounter: Secondary | ICD-10-CM | POA: Insufficient documentation

## 2022-11-07 DIAGNOSIS — S8992XA Unspecified injury of left lower leg, initial encounter: Secondary | ICD-10-CM | POA: Insufficient documentation

## 2022-11-07 MED ORDER — HYDROCODONE-ACETAMINOPHEN 5-325 MG PO TABS: 1.0000 | ORAL_TABLET | Freq: Four times a day (QID) | ORAL | 0 refills | Status: AC | PRN

## 2022-11-07 MED ORDER — HYDROCODONE-ACETAMINOPHEN 5-325 MG PO TABS
1.0000 | ORAL_TABLET | Freq: Once | ORAL | Status: AC
  Administered 2022-11-07: 1 via ORAL
  Filled 2022-11-07: qty 1
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
## 2022-11-07 NOTE — ED Notes (Signed)
Pt was dc'd 1338

## 2022-11-07 NOTE — ED Notes (Signed)
Patient verbalizes understanding of discharge instructions. Opportunity for questioning and answers were provided. Patient discharged from ED.  °

## 2022-12-20 ENCOUNTER — Ambulatory Visit (HOSPITAL_BASED_OUTPATIENT_CLINIC_OR_DEPARTMENT_OTHER): Payer: Self-pay | Admitting: Family Medicine
